# Patient Record
Sex: Female | Born: 1975 | Race: White | Hispanic: Yes | Marital: Married | Smoking: Former smoker
Health system: Southern US, Community
[De-identification: ages and names within clinical notes are randomized; demographics above are authoritative.]

## PROBLEM LIST (undated history)

## (undated) DIAGNOSIS — E119 Type 2 diabetes mellitus without complications: Secondary | ICD-10-CM

## (undated) DIAGNOSIS — F32A Depression, unspecified: Secondary | ICD-10-CM

## (undated) DIAGNOSIS — E669 Obesity, unspecified: Secondary | ICD-10-CM

## (undated) DIAGNOSIS — F329 Major depressive disorder, single episode, unspecified: Secondary | ICD-10-CM

## (undated) DIAGNOSIS — H11153 Pinguecula, bilateral: Secondary | ICD-10-CM

## (undated) DIAGNOSIS — E785 Hyperlipidemia, unspecified: Secondary | ICD-10-CM

## (undated) HISTORY — DX: Hyperlipidemia, unspecified: E78.5

## (undated) HISTORY — DX: Type 2 diabetes mellitus without complications: E11.9

## (undated) HISTORY — DX: Major depressive disorder, single episode, unspecified: F32.9

## (undated) HISTORY — DX: Pinguecula, bilateral: H11.153

## (undated) HISTORY — DX: Depression, unspecified: F32.A

## (undated) HISTORY — DX: Obesity, unspecified: E66.9

---

## 1999-11-17 ENCOUNTER — Emergency Department (HOSPITAL_COMMUNITY): Admission: EM | Admit: 1999-11-17 | Discharge: 1999-11-17 | Payer: Self-pay | Admitting: Emergency Medicine

## 1999-11-19 ENCOUNTER — Encounter: Payer: Self-pay | Admitting: Emergency Medicine

## 2000-02-06 ENCOUNTER — Ambulatory Visit (HOSPITAL_COMMUNITY): Admission: RE | Admit: 2000-02-06 | Discharge: 2000-02-06 | Payer: Self-pay | Admitting: *Deleted

## 2000-05-19 ENCOUNTER — Inpatient Hospital Stay (HOSPITAL_COMMUNITY): Admission: AD | Admit: 2000-05-19 | Discharge: 2000-05-19 | Payer: Self-pay | Admitting: Obstetrics

## 2000-07-02 ENCOUNTER — Inpatient Hospital Stay (HOSPITAL_COMMUNITY): Admission: AD | Admit: 2000-07-02 | Discharge: 2000-07-05 | Payer: Self-pay | Admitting: Obstetrics

## 2000-07-02 ENCOUNTER — Encounter (INDEPENDENT_AMBULATORY_CARE_PROVIDER_SITE_OTHER): Payer: Self-pay

## 2004-06-22 ENCOUNTER — Ambulatory Visit: Payer: Self-pay | Admitting: Internal Medicine

## 2004-08-30 ENCOUNTER — Ambulatory Visit: Payer: Self-pay | Admitting: Family Medicine

## 2004-09-28 ENCOUNTER — Ambulatory Visit: Payer: Self-pay | Admitting: Family Medicine

## 2004-10-27 ENCOUNTER — Ambulatory Visit: Payer: Self-pay | Admitting: Family Medicine

## 2004-10-27 ENCOUNTER — Ambulatory Visit: Payer: Self-pay | Admitting: *Deleted

## 2005-03-19 ENCOUNTER — Ambulatory Visit: Payer: Self-pay | Admitting: Family Medicine

## 2005-04-20 ENCOUNTER — Ambulatory Visit: Payer: Self-pay | Admitting: Family Medicine

## 2005-05-30 ENCOUNTER — Emergency Department (HOSPITAL_COMMUNITY): Admission: EM | Admit: 2005-05-30 | Discharge: 2005-05-30 | Payer: Self-pay | Admitting: Emergency Medicine

## 2006-05-06 ENCOUNTER — Ambulatory Visit: Payer: Self-pay | Admitting: Family Medicine

## 2006-06-13 ENCOUNTER — Ambulatory Visit (HOSPITAL_COMMUNITY): Admission: RE | Admit: 2006-06-13 | Discharge: 2006-06-13 | Payer: Self-pay | Admitting: Family Medicine

## 2006-06-13 ENCOUNTER — Ambulatory Visit: Payer: Self-pay | Admitting: Family Medicine

## 2007-01-08 ENCOUNTER — Encounter (INDEPENDENT_AMBULATORY_CARE_PROVIDER_SITE_OTHER): Payer: Self-pay | Admitting: *Deleted

## 2007-04-24 DIAGNOSIS — E782 Mixed hyperlipidemia: Secondary | ICD-10-CM | POA: Insufficient documentation

## 2007-04-24 DIAGNOSIS — E785 Hyperlipidemia, unspecified: Secondary | ICD-10-CM

## 2007-04-24 HISTORY — DX: Hyperlipidemia, unspecified: E78.5

## 2007-04-28 ENCOUNTER — Ambulatory Visit: Payer: Self-pay | Admitting: Internal Medicine

## 2007-07-11 ENCOUNTER — Ambulatory Visit: Payer: Self-pay | Admitting: Family Medicine

## 2007-07-11 ENCOUNTER — Encounter (INDEPENDENT_AMBULATORY_CARE_PROVIDER_SITE_OTHER): Payer: Self-pay | Admitting: Nurse Practitioner

## 2007-07-11 ENCOUNTER — Encounter (INDEPENDENT_AMBULATORY_CARE_PROVIDER_SITE_OTHER): Payer: Self-pay | Admitting: Family Medicine

## 2007-07-11 LAB — CONVERTED CEMR LAB
Chlamydia, DNA Probe: NEGATIVE
GC Probe Amp, Genital: NEGATIVE

## 2007-07-22 ENCOUNTER — Ambulatory Visit: Payer: Self-pay | Admitting: Internal Medicine

## 2007-07-24 ENCOUNTER — Ambulatory Visit (HOSPITAL_COMMUNITY): Admission: RE | Admit: 2007-07-24 | Discharge: 2007-07-24 | Payer: Self-pay | Admitting: Family Medicine

## 2007-08-13 ENCOUNTER — Ambulatory Visit: Payer: Self-pay | Admitting: Family Medicine

## 2007-08-13 LAB — CONVERTED CEMR LAB
ALT: 16 units/L (ref 0–35)
CO2: 23 meq/L (ref 19–32)
Calcium: 9.2 mg/dL (ref 8.4–10.5)
Chloride: 106 meq/L (ref 96–112)
Cholesterol: 228 mg/dL — ABNORMAL HIGH (ref 0–200)
Creatinine, Ser: 0.68 mg/dL (ref 0.40–1.20)
Eosinophils Relative: 6 % — ABNORMAL HIGH (ref 0–5)
Free T4: 1.16 ng/dL (ref 0.89–1.80)
Glucose, Bld: 90 mg/dL (ref 70–99)
HCT: 42 % (ref 36.0–46.0)
Hemoglobin: 13.4 g/dL (ref 12.0–15.0)
Lymphocytes Relative: 35 % (ref 12–46)
Lymphs Abs: 2.1 10*3/uL (ref 0.7–4.0)
Monocytes Absolute: 0.4 10*3/uL (ref 0.1–1.0)
Monocytes Relative: 8 % (ref 3–12)
Neutro Abs: 2.9 10*3/uL (ref 1.7–7.7)
RBC: 4.76 M/uL (ref 3.87–5.11)
Total Bilirubin: 0.5 mg/dL (ref 0.3–1.2)
Total CHOL/HDL Ratio: 5.2
Triglycerides: 166 mg/dL — ABNORMAL HIGH (ref <150)
VLDL: 33 mg/dL (ref 0–40)
WBC: 5.8 10*3/uL (ref 4.0–10.5)

## 2007-09-08 ENCOUNTER — Ambulatory Visit: Payer: Self-pay | Admitting: Family Medicine

## 2007-09-16 ENCOUNTER — Ambulatory Visit (HOSPITAL_COMMUNITY): Admission: RE | Admit: 2007-09-16 | Discharge: 2007-09-16 | Payer: Self-pay | Admitting: Family Medicine

## 2009-08-10 IMAGING — US US TRANSVAGINAL NON-OB
1 series · 14 of 25 positions shown · non-contrast
Comparison: None

CLINICAL DATA: Pelvic pain mainly on the right

TRANSVAGINAL ULTRASOUND OF PELVIS,ULTRASOUND PELVIS COMPLETE -
MODIFY
TECHNIQUE: Transvaginal ultrasound examination of the pelvis was
performed including evaluation of the uterus, ovaries, adnexal
regions, and pelvic cul-de-sac,

[Series 1: unknown · 0.28mm/px · 14 of 55 slices shown]
[im 1/55]
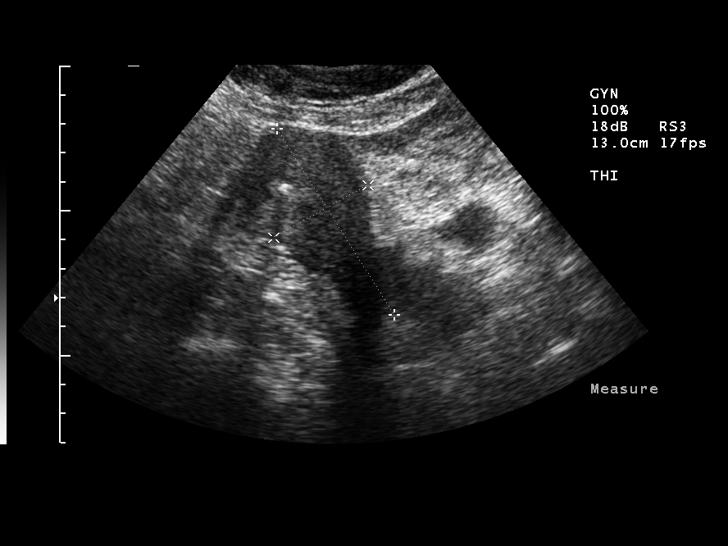
[im 5/55]
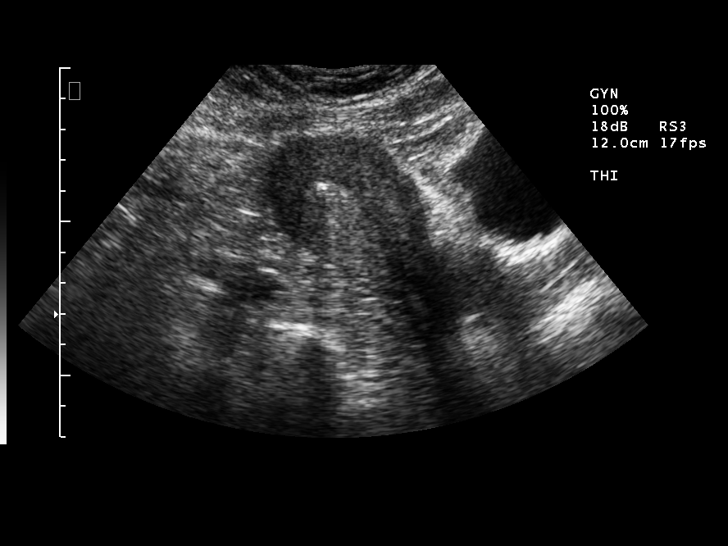
[im 10/55]
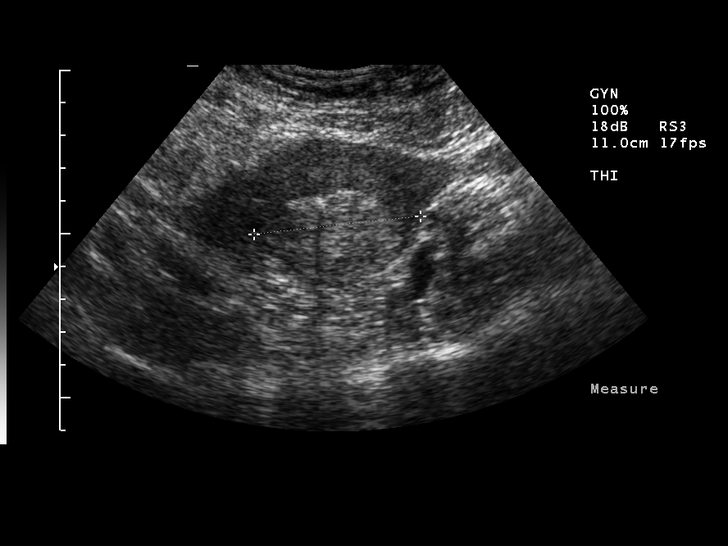
[im 14/55]
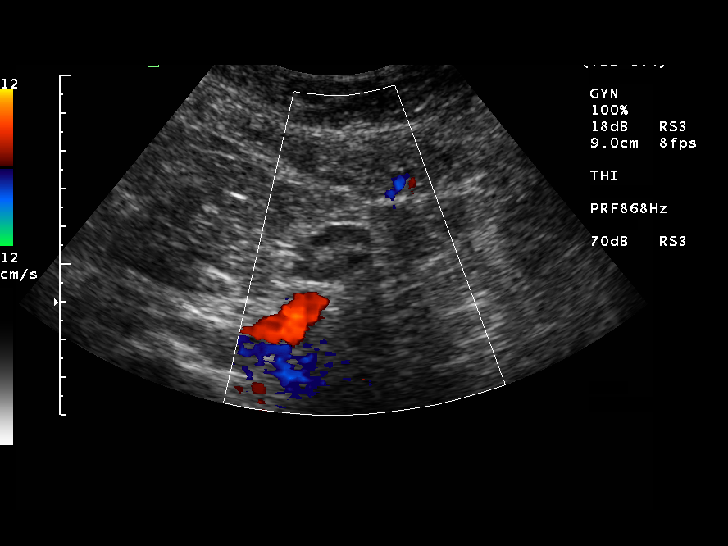
[im 19/55]
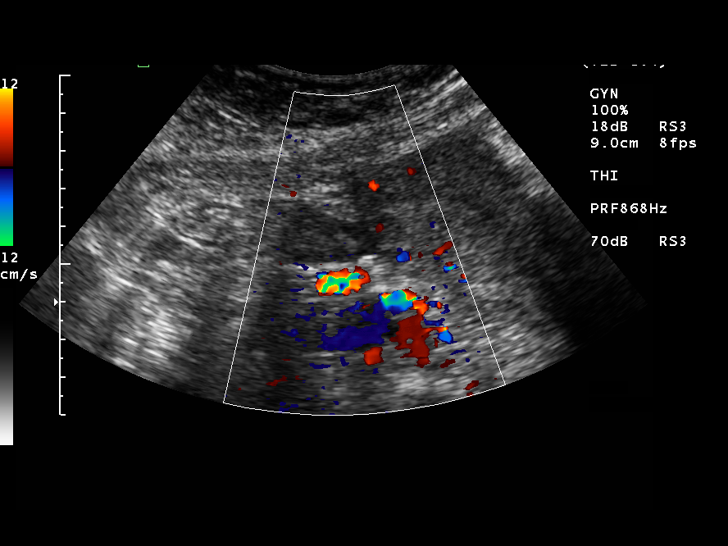
[im 21/55]
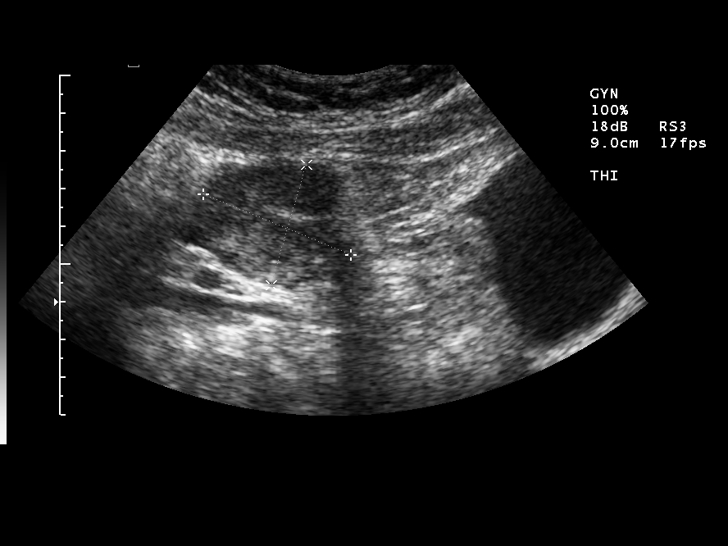
[im 25/55]
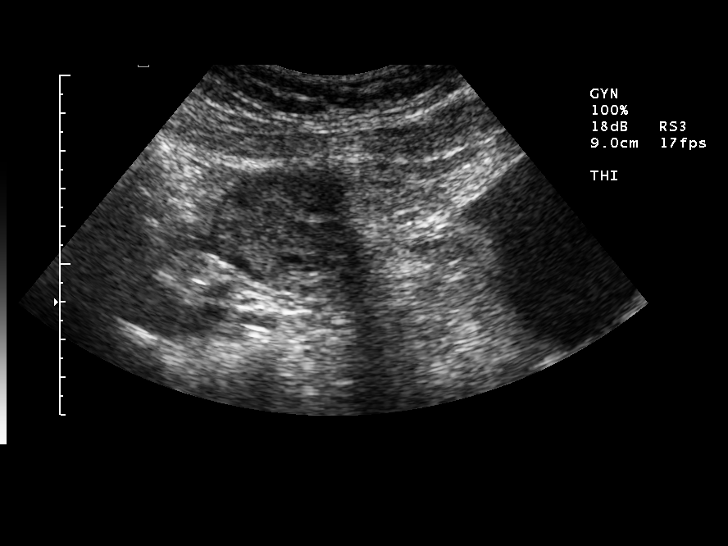
[im 30/55]
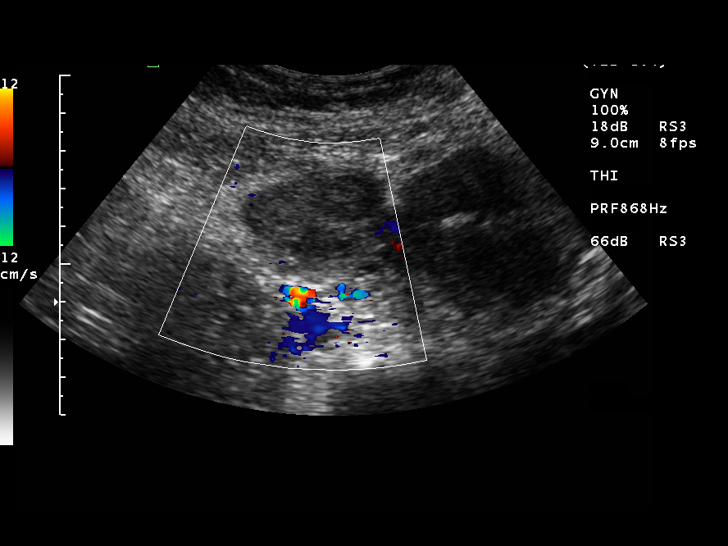
[im 34/55]
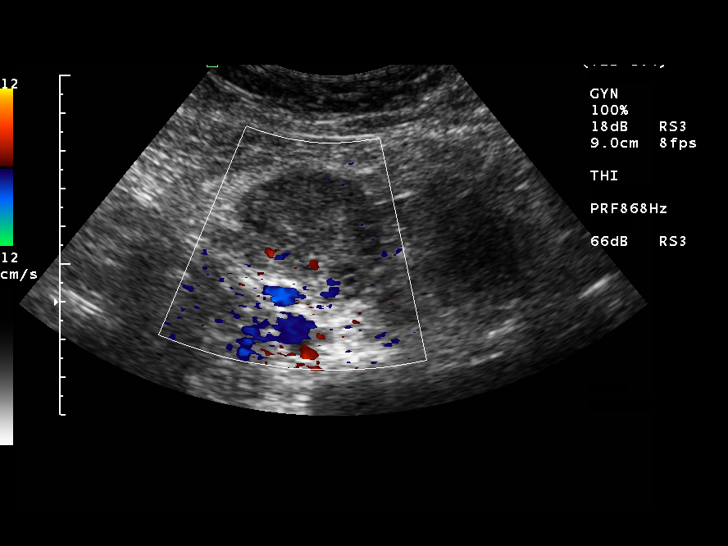
[im 37/55]
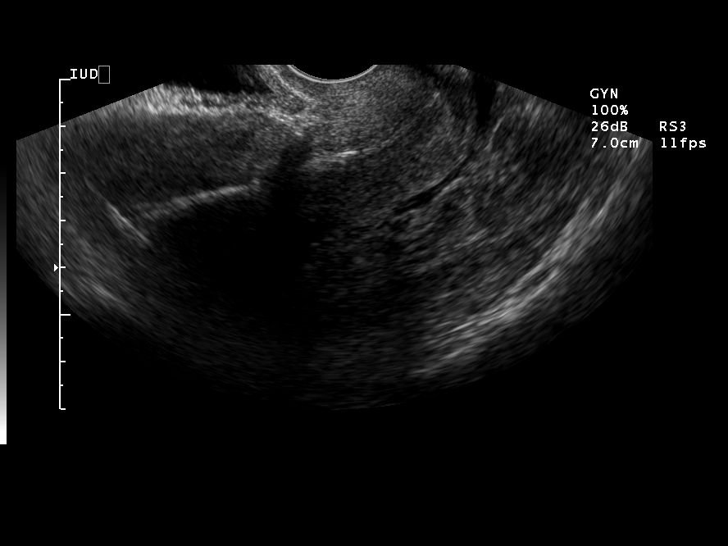
[im 41/55]
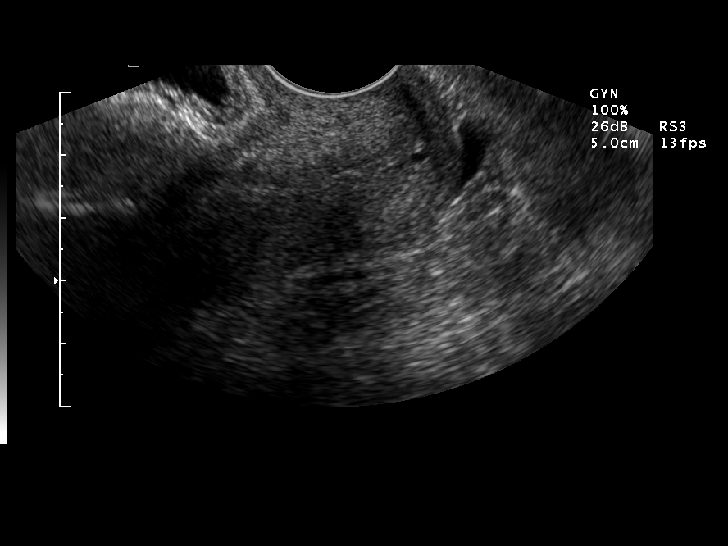
[im 46/55]
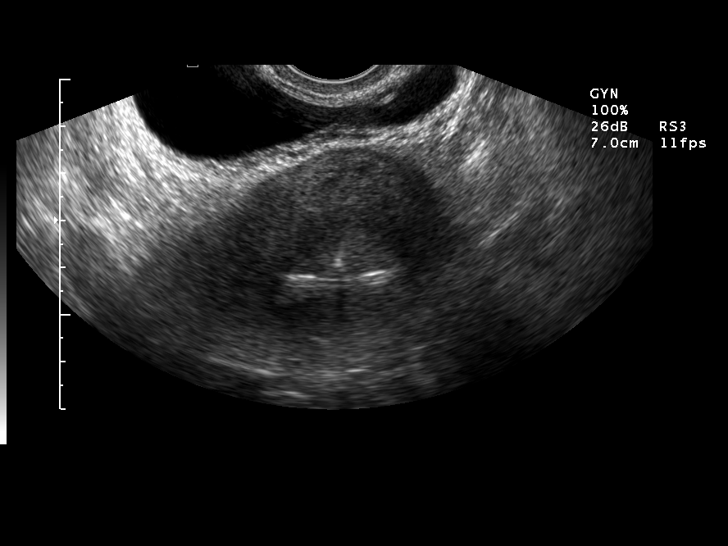
[im 50/55]
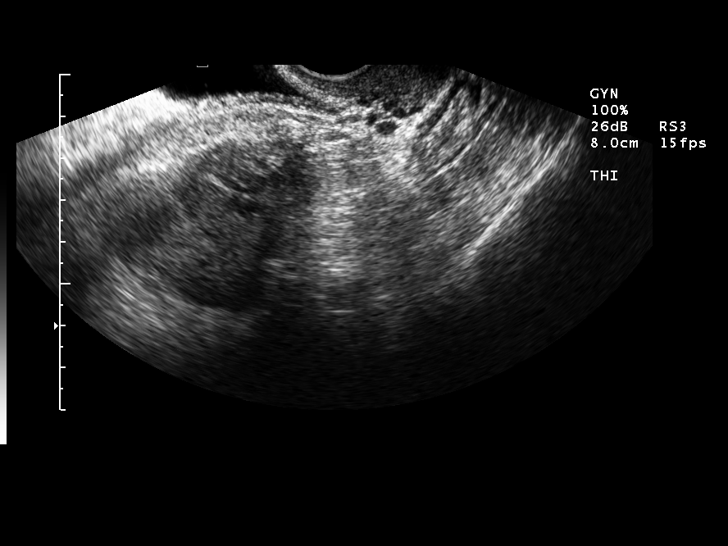
[im 55/55]
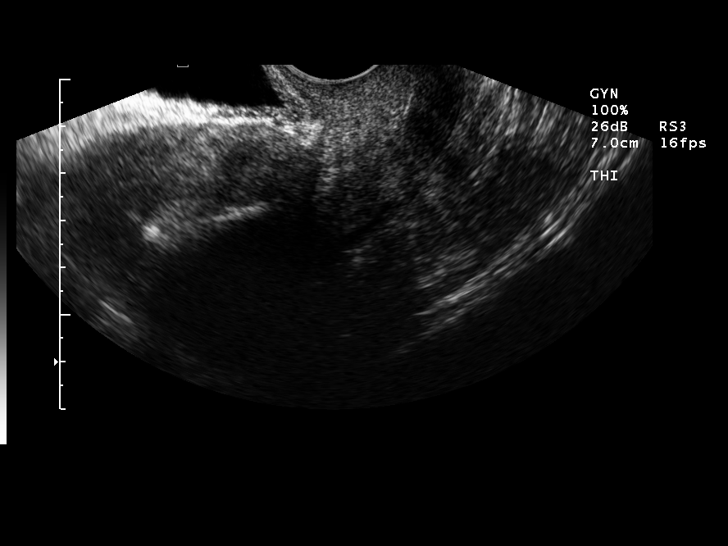

[14 of 25 positions shown; findings below may reference images not displayed]

FINDINGS: The uterus measures 8.0 cm in length and 3.2 cm x 5.1 cm
in  transverse dimensions.  Endometrial stripe complex measures 9mm
in thickness.  The right ovary measures 4.2 x 3.3 x 4.3 cm.  Left
ovary measures 2.5 x 1.3 x 2.2 cm.  The right ovary appears
somewhat prominent caliber and appears somewhat lobulated, however
no distinct mass is detected. It is possible that there is a
complex/hemorrhagic right ovarian cyst. There is color flow to the
right ovary.  Small amount of free pelvic fluid.
IMPRESSION: Prominent somewhat lobulated appearance to the right ovary without
distinct mass. Cannot exclude a complex hemorrhagic cyst of the
right ovary.  Recommend follow-up ultrasound in 4-6 weeks.  Small
amount of free pelvic fluid.

## 2010-04-23 HISTORY — PX: TUBAL LIGATION: SHX77

## 2010-09-08 NOTE — Op Note (Signed)
[facility]  Patient:    Kelsey Keith             MRN: [id number] Proc. Date: 07/02/00 Adm. Date:  [id number] Disc. Date: [id number] Attending:  Tammi Sou                           Operative Report  PREOPERATIVE DIAGNOSES:       1. Arrest of active phase of labor.                               2. Repetitive late fetal heart rate                                  decelerations.                               3. Deep variable fetal heart rate decelerations.                               4. Meconium-stained amniotic fluid.  POSTOPERATIVE DIAGNOSES:      1. Arrest of active phase of labor.                               2. Repetitive late fetal heart rate                                  decelerations.                               3. Deep variable fetal heart rate decelerations.                               4. Meconium-stained amniotic fluid.  OPERATION:                    Repeat low transverse cesarean delivery.  SURGEON:                      Conni Elliot, M.D.  ASSISTANT:                    Jamey Reas, M.D.  ANESTHESIA:                   Continuous lumbar epidural.  OPERATIVE FINDINGS:           Female infant with Apgars of 9 and 9. Cord pH was 7.35 Placenta was sent to pathology.  ESTIMATED BLOOD LOSS:         800 cc.  DESCRIPTION OF PROCEDURE:     The patient is supine in left lateral tilt position receiving oxygen. The abdomen was prepped and draped in sterile fashion. A low transverse Pfannenstiel incision was made and incision made in through the skin, subcutaneous fascia and the rectus muscles separated in the midline. The peritoneal cavity was entered and bladder flap created. A low transverse uterine incision was made. The baby was in a vertex  presentation. The baby was DeLee suctioned prior to delivery of the chest. The remainder of the body was delivered, the cord doubly clamped and cut, and the  baby handed to the neonatologist in attendance. The placenta delivered spontaneously. The uterus, bladder flap, anterior peritoneum, fascia, and skin closed in routine fashion. Estimated blood loss approximately 800 cc and needle, sponge, and instruments were correct. DD:  08/21/00 TD:  08/21/00 Job: [id number]

## 2010-09-08 NOTE — Discharge Summary (Signed)
[facility]  Patient:    Kelsey Keith             MRN: [id number] Adm. Date:  [id number] Disc. Date: 07/05/00 Attending:  Tammi Sou Dictator:   Ocie Doyne, M.D.                           Discharge Summary  DATE OF BIRTH:                Nov 06, 1975  DISCHARGE DIAGNOSES:          1. Status post low transverse cesarean section,                                  delivery of a viable female infant.                               2. Arrest of active phase of labor.  DISCHARGE MEDICATIONS:        1. Motrin 600 mg one p.o. q.6h. p.r.n. pain.                               2. Percocet 5/325 mg one p.o. q.6h. p.r.n. pain.                               3. Prenatal vitamin one p.o. q.d.  ADMISSION HISTORY:            This 35 year old, G2, P 1-0-0-1, at 64 and [redacted] weeks gestation by LMP and second trimester ultrasound presented with uterine contractions, spontaneous rupture of membranes, and meconium amniotic fluid. She had obtained prenatal care at [facility].  MEDICATIONS PRIOR TO ADMISSION:  Prenatal vitamins.  SOCIAL HISTORY:               She is a nonsmoker, nondrinker.  PHYSICAL EXAMINATION ON ADMISSION:  VITAL SIGNS:                  Her vitals were stable.  EXTREMITIES:                  There was no clonus, no edema.  PELVIC:                       Sterile vaginal exam revealed a cervix 2 cm dilated, 100% effaced at -1 station with vertex presentation, positive ferning, positive meconium.  Fetal heart rate was in the 130s with good long-term variability, no decelerations, and nonreactive.  Contractions were every three minutes lasting 60 seconds of moderate intensity.  HOSPITAL COURSE:              The patient was admitted to labor and delivery for expectant management.  An IUPC and fetal scalp electrode were placed and amnioinfusion was initiated.  At the patients request, she received epidural anesthesia for pain  management.  After several hours of adequate labor, fetal heart tones were nonreassuring with repetitive late decelerations.  They did improve with administration of oxygen to the mother and repositioning onto her side; however, the active phase of labor appeared to have stalled at 5-6 cm of cervical dilation, and she was taken to the OR for a  repeat low transverse cesarean section.  She delivered a viable female infant in cephalic presentation with one and five minute Apgars of 9 and 9 respectively.  Cord pH was 7.35.  There were no intraoperative complications.  Estimated blood loss was 800 cc.  The patients postpartum course was unremarkable.  At discharge she had minimal lochia, was complaining of some mild abdominal pain which was adequately controlled on p.o. Percocet, had had a bowel movement, and was ambulating and eating without difficulty.  Her incision was clean, dry, and intact with no erythema and no drainage.  She was breast feeding her infant without complications and stated a desire to have the Depo-Provera injection prior to discharge for contraception.  DISCHARGE FOLLOWUP:           She will follow up at the [facility] in six weeks for a routine postpartum check. DD:  07/05/00 TD:  07/05/00 Job: [id number]

## 2010-10-18 ENCOUNTER — Encounter (HOSPITAL_COMMUNITY)
Admission: RE | Admit: 2010-10-18 | Discharge: 2010-10-18 | Disposition: A | Discharge status: Home / Self Care | Payer: Self-pay | Source: Ambulatory Visit | Attending: Obstetrics | Admitting: Obstetrics

## 2010-10-18 DIAGNOSIS — Z01812 Encounter for preprocedural laboratory examination: Secondary | ICD-10-CM | POA: Insufficient documentation

## 2010-10-18 LAB — SURGICAL PCR SCREEN: Staphylococcus aureus: NEGATIVE

## 2010-10-18 LAB — CBC
HCT: 37 % (ref 36.0–46.0)
Hemoglobin: 12.4 g/dL (ref 12.0–15.0)
MCV: 81.1 fL (ref 78.0–100.0)
RDW: 15.2 % (ref 11.5–15.5)
WBC: 8.8 10*3/uL (ref 4.0–10.5)

## 2010-10-18 LAB — RPR: RPR Ser Ql: NONREACTIVE

## 2010-10-26 ENCOUNTER — Inpatient Hospital Stay (HOSPITAL_COMMUNITY): Admission: RE | Admit: 2010-10-26 | Payer: Self-pay | Source: Ambulatory Visit | Admitting: Obstetrics

## 2010-10-26 ENCOUNTER — Other Ambulatory Visit: Payer: Self-pay | Admitting: Obstetrics

## 2010-10-26 ENCOUNTER — Inpatient Hospital Stay (HOSPITAL_COMMUNITY)
Admission: RE | Admit: 2010-10-26 | Discharge: 2010-10-29 | DRG: 766 | Disposition: A | Discharge status: Home / Self Care | Payer: Medicaid Other | Source: Ambulatory Visit | Attending: Obstetrics | Admitting: Obstetrics

## 2010-10-26 DIAGNOSIS — Z302 Encounter for sterilization: Secondary | ICD-10-CM

## 2010-10-26 DIAGNOSIS — O34219 Maternal care for unspecified type scar from previous cesarean delivery: Principal | ICD-10-CM | POA: Diagnosis present

## 2010-10-26 DIAGNOSIS — Z01812 Encounter for preprocedural laboratory examination: Secondary | ICD-10-CM

## 2010-10-26 DIAGNOSIS — Z01818 Encounter for other preprocedural examination: Secondary | ICD-10-CM

## 2010-10-26 LAB — TYPE AND SCREEN: Antibody Screen: NEGATIVE

## 2010-10-26 LAB — ABO/RH: ABO/RH(D): O POS

## 2010-10-27 LAB — CBC
MCH: 26.6 pg (ref 26.0–34.0)
MCHC: 32.7 g/dL (ref 30.0–36.0)
Platelets: 207 10*3/uL (ref 150–400)
RDW: 15.8 % — ABNORMAL HIGH (ref 11.5–15.5)

## 2010-10-27 MED ORDER — DIPHENHYDRAMINE HCL 50 MG/ML IJ SOLN: 12.5000 mg | INTRAMUSCULAR | Status: DC | PRN

## 2010-10-27 MED ORDER — NALOXONE HCL 0.4 MG/ML IJ SOLN
1.0000 ug/kg/h | INTRAVENOUS | Status: DC | PRN
  Filled 2010-10-27: qty 2.5

## 2010-10-27 MED ORDER — LACTATED RINGERS IV SOLN: INTRAVENOUS | Status: DC

## 2010-10-27 MED ORDER — PSEUDOEPHEDRINE HCL 30 MG PO TABS: 60.0000 mg | ORAL_TABLET | ORAL | Status: DC | PRN

## 2010-10-27 MED ORDER — MEASLES, MUMPS & RUBELLA VAC ~~LOC~~ INJ
0.5000 mL | INJECTION | Freq: Once | SUBCUTANEOUS | Status: AC | PRN
  Filled 2010-10-27: qty 0.5

## 2010-10-27 MED ORDER — SENNOSIDES-DOCUSATE SODIUM 8.6-50 MG PO TABS
1.0000 | ORAL_TABLET | Freq: Every day | ORAL | Status: DC
  Administered 2010-10-29: 1 via ORAL
  Filled 2010-10-27: qty 2

## 2010-10-27 MED ORDER — NALBUPHINE HCL 10 MG/ML IJ SOLN
5.0000 mg | INTRAMUSCULAR | Status: DC | PRN
  Filled 2010-10-27: qty 1

## 2010-10-27 MED ORDER — SIMETHICONE 80 MG PO CHEW
80.0000 mg | CHEWABLE_TABLET | ORAL | Status: DC | PRN
  Filled 2010-10-27: qty 1

## 2010-10-27 MED ORDER — MEDROXYPROGESTERONE ACETATE 150 MG/ML IM SUSP: 150.0000 mg | Freq: Once | INTRAMUSCULAR | Status: AC | PRN

## 2010-10-27 MED ORDER — MENTHOL 3 MG MT LOZG: 1.0000 | LOZENGE | OROMUCOSAL | Status: DC | PRN

## 2010-10-27 MED ORDER — MAGNESIUM HYDROXIDE 400 MG/5ML PO SUSP: 30.0000 mL | ORAL | Status: DC | PRN

## 2010-10-27 MED ORDER — DIPHENHYDRAMINE HCL 50 MG/ML IJ SOLN: 25.0000 mg | INTRAMUSCULAR | Status: DC | PRN

## 2010-10-27 MED ORDER — ONDANSETRON HCL 4 MG/2ML IJ SOLN: 4.0000 mg | Freq: Three times a day (TID) | INTRAMUSCULAR | Status: DC | PRN

## 2010-10-27 MED ORDER — BISACODYL 10 MG RE SUPP: 10.0000 mg | Freq: Every day | RECTAL | Status: DC | PRN

## 2010-10-27 MED ORDER — ONDANSETRON HCL 4 MG/2ML IJ SOLN: 4.0000 mg | INTRAMUSCULAR | Status: DC | PRN

## 2010-10-27 MED ORDER — FERROUS SULFATE 325 (65 FE) MG PO TABS
325.0000 mg | ORAL_TABLET | Freq: Two times a day (BID) | ORAL | Status: DC
  Administered 2010-10-28 – 2010-10-29 (×3): 325 mg via ORAL
  Filled 2010-10-27 (×3): qty 1

## 2010-10-27 MED ORDER — CALCIUM CARBONATE ANTACID 500 MG PO CHEW: 1.0000 | CHEWABLE_TABLET | ORAL | Status: DC | PRN

## 2010-10-27 MED ORDER — NALOXONE HCL 0.4 MG/ML IJ SOLN: 0.4000 mg | INTRAMUSCULAR | Status: DC | PRN

## 2010-10-27 MED ORDER — HYDROCORTISONE 1 % EX OINT: TOPICAL_OINTMENT | Freq: Four times a day (QID) | CUTANEOUS | Status: DC | PRN

## 2010-10-27 MED ORDER — FLEET ENEMA 7-19 GM/118ML RE ENEM
1.0000 | ENEMA | RECTAL | Status: DC | PRN
  Filled 2010-10-27: qty 1

## 2010-10-27 MED ORDER — SODIUM CHLORIDE 0.9 % IJ SOLN
3.0000 mL | INTRAMUSCULAR | Status: DC | PRN
  Filled 2010-10-27: qty 3

## 2010-10-27 MED ORDER — WITCH HAZEL-GLYCERIN EX PADS: MEDICATED_PAD | CUTANEOUS | Status: DC | PRN

## 2010-10-27 MED ORDER — NALOXONE HCL 0.4 MG/ML IJ SOLN: 0.1000 mg | INTRAMUSCULAR | Status: DC | PRN

## 2010-10-27 MED ORDER — DIPHENHYDRAMINE HCL 25 MG PO CAPS: 25.0000 mg | ORAL_CAPSULE | ORAL | Status: DC | PRN

## 2010-10-27 MED ORDER — SIMETHICONE 80 MG PO CHEW
80.0000 mg | CHEWABLE_TABLET | Freq: Three times a day (TID) | ORAL | Status: DC
  Administered 2010-10-28 – 2010-10-29 (×5): 80 mg via ORAL
  Filled 2010-10-27 (×6): qty 1

## 2010-10-27 MED ORDER — IBUPROFEN 600 MG PO TABS: 600.0000 mg | ORAL_TABLET | Freq: Four times a day (QID) | ORAL | Status: DC | PRN

## 2010-10-27 MED ORDER — DIPHENHYDRAMINE HCL 25 MG PO CAPS: 25.0000 mg | ORAL_CAPSULE | Freq: Four times a day (QID) | ORAL | Status: DC | PRN

## 2010-10-27 MED ORDER — ZOLPIDEM TARTRATE 5 MG PO TABS: 5.0000 mg | ORAL_TABLET | Freq: Every evening | ORAL | Status: DC | PRN

## 2010-10-27 MED ORDER — METHYLERGONOVINE MALEATE 0.2 MG/ML IJ SOLN: 0.2000 mg | Freq: Four times a day (QID) | INTRAMUSCULAR | Status: DC | PRN

## 2010-10-27 MED ORDER — SODIUM CHLORIDE 0.9 % IJ SOLN
3.0000 mL | Freq: Two times a day (BID) | INTRAMUSCULAR | Status: DC
  Filled 2010-10-27 (×3): qty 3

## 2010-10-27 MED ORDER — GUAIFENESIN 100 MG/5ML PO SOLN: 15.0000 mL | ORAL | Status: DC | PRN

## 2010-10-27 MED ORDER — METOCLOPRAMIDE HCL 5 MG/ML IJ SOLN: 10.0000 mg | Freq: Three times a day (TID) | INTRAMUSCULAR | Status: DC | PRN

## 2010-10-27 MED ORDER — DIBUCAINE 1 % RE OINT: TOPICAL_OINTMENT | RECTAL | Status: DC | PRN

## 2010-10-27 MED ORDER — HYDROCORTISONE ACE-PRAMOXINE 1-1 % RE FOAM: 1.0000 | RECTAL | Status: DC | PRN

## 2010-10-27 MED ORDER — ONDANSETRON HCL 4 MG PO TABS: 4.0000 mg | ORAL_TABLET | ORAL | Status: DC | PRN

## 2010-10-27 MED ORDER — METHYLERGONOVINE MALEATE 0.2 MG PO TABS: 0.2000 mg | ORAL_TABLET | ORAL | Status: DC | PRN

## 2010-10-27 MED ORDER — OXYCODONE-ACETAMINOPHEN 5-325 MG PO TABS
1.0000 | ORAL_TABLET | ORAL | Status: DC | PRN
  Administered 2010-10-28 – 2010-10-29 (×3): 1 via ORAL
  Filled 2010-10-27 (×2): qty 1

## 2010-10-27 MED ORDER — HYDROCORTISONE 1 % EX CREA: TOPICAL_CREAM | Freq: Four times a day (QID) | CUTANEOUS | Status: DC | PRN

## 2010-10-27 MED ORDER — IBUPROFEN 600 MG PO TABS
600.0000 mg | ORAL_TABLET | Freq: Four times a day (QID) | ORAL | Status: DC
  Administered 2010-10-28 – 2010-10-29 (×5): 600 mg via ORAL
  Filled 2010-10-27 (×4): qty 1

## 2010-10-28 NOTE — Progress Notes (Signed)
Post Partum Day 2 Subjective: no complaints  Objective: Blood pressure 125/76, pulse 89, temperature 98 F (36.7 C), temperature source Oral, resp. rate 18, height 5' (1.524 m), weight 75.75 kg (167 lb).  Physical Exam:  General: alert Lochia: appropriate Uterine Fundus: firm Incision: healing well DVT Evaluation: No evidence of DVT seen on physical exam.   Basename 10/27/10 0535  HGB 10.9*  HCT 33.3*    Assessment/Plan: Plan for discharge tomorrow   LOS: 2 days   Taimane Stimmel A 10/28/2010, 7:54 AM

## 2010-10-29 MED ORDER — ACETAMINOPHEN-CODEINE 300-30 MG PO TABS
1.0000 | ORAL_TABLET | ORAL | Status: AC | PRN
Start: 2010-10-29 — End: 2010-11-12

## 2010-10-29 NOTE — Plan of Care (Signed)
Problem: Discharge Progression Outcomes Goal: MMR given as ordered Outcome: Completed/Met Date Met:  10/29/10 immune

## 2010-10-29 NOTE — Progress Notes (Signed)
  Postop day postop day 3 vital signs normal Fundus firm Incision clean and dry Legs negative

## 2010-10-29 NOTE — Discharge Summary (Signed)
Obstetric Discharge Summary Reason for Admission: cesarean section Prenatal Procedures: none Intrapartum Procedures: cesarean: low cervical, transverse Postpartum Procedures: none Complications-Operative and Postpartum: none  Hemoglobin  Date Value Range Status  10/27/2010 10.9* 12.0-15.0 (g/dL) Final     HCT  Date Value Range Status  10/27/2010 33.3* 36.0-46.0 (%) Final    Discharge Diagnoses: Term Pregnancy-delivered  Discharge Information: Date: 10/29/2010 Activity: unrestricted and pelvic rest Diet: routine Medications: Tylenol #3 Condition: stable Instructions: refer to practice specific booklet Discharge to: home Follow-up Information    Follow up with MARSHALL,BERNARD A. Make an appointment in 6 weeks.   Contact information:   [address] [place] [address] [phone]          Newborn Data: Live born  Information for the patient's newborn:  Rashiya, [place] [[id number]]  female ; APGAR , ; weight ;  Home with mother.  MARSHALL,BERNARD A 10/29/2010, 6:50 AM

## 2010-11-02 ENCOUNTER — Ambulatory Visit (HOSPITAL_COMMUNITY)
Admit: 2010-11-02 | Discharge: 2010-11-02 | Disposition: A | Discharge status: Home / Self Care | Payer: Medicaid Other | Attending: Obstetrics | Admitting: Obstetrics

## 2010-11-02 NOTE — Progress Notes (Signed)
MOM AND BABY WERE SCHEDULED FOR FEEDING ASSESSMENT PRIOR TO DISCHARGE BECAUSE OF >7% WEIGHT LOSS IN HOSPITAL AND USING SNS.  WEIGHT TODAY 8-3.7  BABY HAS BEEN FEEDING EVERY 1-3 HOURS FOR 15-30 MIN. AND NO SUPPLEMENT BEING GIVEN.  BABY LATCHED EASILY IN OFFICE TODAY WITH MINIMAL ASSIST IN OBTAINING WIDER LATCH.  BABY NURSED WELL ON BOTH BREASTS WITH PC WEIGHT GAIN OF 60 CC'S.  5-6 VOIDS/24 HRS.  2-3 STOOLS/24 HRS.  BASIC BF TEACHING REVIEWED.    ENCOURAGED TO CALL WITH QUESTIONS OR CONERNS.

## 2010-11-05 ENCOUNTER — Inpatient Hospital Stay (HOSPITAL_COMMUNITY): Admission: AD | Admit: 2010-11-05 | Payer: Self-pay | Source: Home / Self Care | Admitting: Obstetrics

## 2014-06-28 ENCOUNTER — Telehealth: Payer: Self-pay | Admitting: *Deleted

## 2014-06-28 NOTE — Telephone Encounter (Signed)
error 

## 2016-06-13 ENCOUNTER — Ambulatory Visit: Payer: Self-pay | Admitting: Internal Medicine

## 2016-07-09 ENCOUNTER — Encounter: Payer: Self-pay | Admitting: Internal Medicine

## 2016-07-09 ENCOUNTER — Ambulatory Visit (INDEPENDENT_AMBULATORY_CARE_PROVIDER_SITE_OTHER): Payer: Self-pay | Admitting: Internal Medicine

## 2016-07-09 VITALS — BP 124/72 | HR 86 | Resp 12 | Ht 61.0 in | Wt 191.0 lb

## 2016-07-09 DIAGNOSIS — F32A Depression, unspecified: Secondary | ICD-10-CM | POA: Insufficient documentation

## 2016-07-09 DIAGNOSIS — E782 Mixed hyperlipidemia: Secondary | ICD-10-CM

## 2016-07-09 DIAGNOSIS — R079 Chest pain, unspecified: Secondary | ICD-10-CM

## 2016-07-09 DIAGNOSIS — Z6836 Body mass index (BMI) 36.0-36.9, adult: Secondary | ICD-10-CM

## 2016-07-09 DIAGNOSIS — F329 Major depressive disorder, single episode, unspecified: Secondary | ICD-10-CM | POA: Insufficient documentation

## 2016-07-09 DIAGNOSIS — E669 Obesity, unspecified: Secondary | ICD-10-CM

## 2016-07-09 DIAGNOSIS — J351 Hypertrophy of tonsils: Secondary | ICD-10-CM

## 2016-07-09 DIAGNOSIS — E6609 Other obesity due to excess calories: Secondary | ICD-10-CM

## 2016-07-09 DIAGNOSIS — R0683 Snoring: Secondary | ICD-10-CM

## 2016-07-09 DIAGNOSIS — F339 Major depressive disorder, recurrent, unspecified: Secondary | ICD-10-CM | POA: Insufficient documentation

## 2016-07-09 HISTORY — DX: Obesity, unspecified: E66.9

## 2016-07-09 MED ORDER — NITROGLYCERIN 0.4 MG SL SUBL: 0.4000 mg | SUBLINGUAL_TABLET | SUBLINGUAL | 0 refills | Status: DC | PRN

## 2016-07-09 NOTE — Patient Instructions (Signed)
Tome aspirin 81 mg todos los dias con desayuno.

## 2016-07-09 NOTE — Progress Notes (Signed)
Subjective:    Patient ID: Kelsey Keith, female    DOB: July 15, 1975, 41 y.o.   MRN: [id number]  HPI   Here to establish  1.  Chest pain:  Pinching in chest  in very limited area just lateral to left sternum for 2-3 months--felt like a real thin needle.  Could happen at any time--she really did not really pay attention.  Later, perhaps more likely if walking.  Would last for 20 seconds.  She would stop whatever she was doing and put pressure against her chest for 2-3 minutes.  She would return to what she was doing, but more slowly and generally her symptoms would not recur.   Feels this would occur once or twice weekly. Two weeks ago, started having chest pain that is more like someone is standing on chest.  This would occur only if walking or exerting herself.   Last episode 1 week ago. No radiation of discomfort to shoulder, jaw, arm, or back. No associated dyspnea, diaphoresis, or nausea. Parents are both dead.  Mother died from an MI as a complication of some sort of abdominal problem in her 23s.  She also suffered a stroke prior to that.  Father suffered a debilitating stroke in his 11s as well, leaving him bed bound, but died from an MI.   Maternal uncles with history of MI, but all in their 45s or older (4)  Has never been told she has diabetes or her sugar is high--90 in 2009 fasting..   Her cholesterol in 2009 was 228 with HDL at 44, LDL 151 and Trigs of 166  History of smoking for 20 years. 1 ppd.   Has not smoked for 5 years.    Does have obesity.  She does snore when she sleeps and can have apneic episodes up to about 20 seconds.  Husband has noted 3 episodes in the past year.    No outpatient prescriptions have been marked as taking for the 07/09/16 encounter (Office Visit) with [name], MD.    No Known Allergies   Past Medical History:  Diagnosis Date  . Depression age 42 yo and 2002   First episode age 4 yo, then another after child born 2002.   Was treated for 5 years then with Zoloft.  Has not needed treatment since 2007.  Marland Kitchen Hyperlipidemia 2009  . Obesity 07/09/2016    Past Surgical History:  Procedure Laterality Date  . CESAREAN SECTION  1999, 2002,2012  . TUBAL LIGATION  2012    Family History  Problem Relation Age of Onset  . Heart disease Mother   . Stroke Mother   . Hypertension Mother   . Heart disease Father   . Stroke Father   . Diabetes Brother     Social History   Social History  . Marital status: Married    Spouse name: Alayn  . Number of children: 3  . 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 housecleaning    Social History Main Topics  . Smoking status: Former Smoker    Packs/day: 1.00    Years: 20.00    Types: Cigarettes  . Smokeless tobacco: Never Used  . Alcohol use No  . Drug use: No  . Sexual activity: Not on file   Other Topics Concern  . Not on file   Social History Narrative   Originally from [place]   Came to [employer] [place] [employer]. In 2000   Lives at home husband and  3 children.     Review of Systems     Objective:   Physical Exam  Obese, NAD HEENT:  PERRL, EOMI, discs sharp, TMs pearly gray, throat without injection, but tonsils quite large, though not displacing uvula Neck:  Supple, No adenopathy, no thyromegaly, acanthosis nigricans of posterior neck Chest:  CTA.  Tender along costochondral junction bilaterally with sternum. States tenderness does not reproduce the same pain--states the pain when she has it feels deeper.  CV:  RRR with normal S1 and S2, No S3, S4 or murmur.  No carotid bruits.  Carotid, radial, femoral, DP and PT pulses normal and equal Abd:  S, NT, No HSM or mass, + BS LE:  No edema.  ECG:  NSR, normal      Assessment & Plan:  1.  Atypical chest pain:  ECG without signs of injury.  Certainly with risk factors for CAD with obesity and family history.   CXR and apply for [facility] financial assistance.  Paperwork given. Check CBC,  CMP, lipids, A1C Encouraged healthier diet. ASA 81 mg daily NTG 0.4 mg SL every 5 minutes  x3 if chest pain.  To lie down after using first dose.  Call EMS if no improvement after 3rd dosing. Cardiology referral for further evaluation.  2.  Possible sleep apnea:  Will address possibly with ENT due to size of tonsils and consider where might be able to send for sleep study to evaluate when follows up

## 2016-07-10 ENCOUNTER — Telehealth: Payer: Self-pay | Admitting: Licensed Clinical Social Worker

## 2016-07-10 LAB — CBC WITH DIFFERENTIAL/PLATELET
Basophils Absolute: 0 x10E3/uL (ref 0.0–0.2)
Basos: 0 %
EOS (ABSOLUTE): 0.1 x10E3/uL (ref 0.0–0.4)
Eos: 2 %
Hematocrit: 36.7 % (ref 34.0–46.6)
Hemoglobin: 11.9 g/dL (ref 11.1–15.9)
Immature Grans (Abs): 0 x10E3/uL (ref 0.0–0.1)
Immature Granulocytes: 0 %
Lymphocytes Absolute: 3.2 x10E3/uL — ABNORMAL HIGH (ref 0.7–3.1)
Lymphs: 35 %
MCH: 25.9 pg — ABNORMAL LOW (ref 26.6–33.0)
MCHC: 32.4 g/dL (ref 31.5–35.7)
MCV: 80 fL (ref 79–97)
Monocytes Absolute: 0.6 x10E3/uL (ref 0.1–0.9)
Monocytes: 7 %
Neutrophils Absolute: 5.2 x10E3/uL (ref 1.4–7.0)
Neutrophils: 56 %
Platelets: 262 x10E3/uL (ref 150–379)
RBC: 4.6 x10E6/uL (ref 3.77–5.28)
RDW: 15 % (ref 12.3–15.4)
WBC: 9.2 x10E3/uL (ref 3.4–10.8)

## 2016-07-10 LAB — COMPREHENSIVE METABOLIC PANEL WITH GFR
ALT: 20 IU/L (ref 0–32)
AST: 12 IU/L (ref 0–40)
Albumin/Globulin Ratio: 1.2 (ref 1.2–2.2)
Albumin: 3.8 g/dL (ref 3.5–5.5)
Alkaline Phosphatase: 85 IU/L (ref 39–117)
BUN/Creatinine Ratio: 24 — ABNORMAL HIGH (ref 9–23)
BUN: 12 mg/dL (ref 6–24)
Bilirubin Total: 0.2 mg/dL (ref 0.0–1.2)
CO2: 21 mmol/L (ref 18–29)
Calcium: 8.7 mg/dL (ref 8.7–10.2)
Chloride: 99 mmol/L (ref 96–106)
Creatinine, Ser: 0.49 mg/dL — ABNORMAL LOW (ref 0.57–1.00)
GFR calc Af Amer: 138 mL/min/1.73
GFR calc non Af Amer: 120 mL/min/1.73
Globulin, Total: 3.2 g/dL (ref 1.5–4.5)
Glucose: 229 mg/dL — ABNORMAL HIGH (ref 65–99)
Potassium: 4.3 mmol/L (ref 3.5–5.2)
Sodium: 138 mmol/L (ref 134–144)
Total Protein: 7 g/dL (ref 6.0–8.5)

## 2016-07-10 LAB — LIPID PANEL W/O CHOL/HDL RATIO
Cholesterol, Total: 233 mg/dL — ABNORMAL HIGH (ref 100–199)
HDL: 48 mg/dL
LDL Calculated: 145 mg/dL — ABNORMAL HIGH (ref 0–99)
Triglycerides: 200 mg/dL — ABNORMAL HIGH (ref 0–149)
VLDL Cholesterol Cal: 40 mg/dL (ref 5–40)

## 2016-07-10 LAB — HGB A1C W/O EAG: Hgb A1c MFr Bld: 10.5 % — ABNORMAL HIGH (ref 4.8–5.6)

## 2016-07-10 NOTE — Telephone Encounter (Signed)
LCSW called pt to introduce social work services at the clinic. Pt reported no needs at this time.

## 2016-07-13 ENCOUNTER — Ambulatory Visit (HOSPITAL_COMMUNITY)
Admission: RE | Admit: 2016-07-13 | Discharge: 2016-07-13 | Disposition: A | Discharge status: Home / Self Care | Payer: Self-pay | Source: Ambulatory Visit | Attending: Internal Medicine | Admitting: Internal Medicine

## 2016-07-13 DIAGNOSIS — R079 Chest pain, unspecified: Secondary | ICD-10-CM | POA: Insufficient documentation

## 2016-07-13 NOTE — Addendum Note (Signed)
Addended by: Marcene DuosMULBERRY, Kailey Esquilin M on: 07/13/2016 09:58 AM   Modules accepted: Orders

## 2016-07-14 MED ORDER — METFORMIN HCL ER 500 MG PO TB24: 500.0000 mg | ORAL_TABLET | Freq: Every day | ORAL | 11 refills | Status: DC

## 2016-07-14 NOTE — Addendum Note (Signed)
Addended by: Marcene DuosMULBERRY, Rhiannan Kievit M on: 07/14/2016 11:47 AM   Modules accepted: Orders

## 2016-07-16 ENCOUNTER — Other Ambulatory Visit: Payer: Self-pay | Admitting: Obstetrics and Gynecology

## 2016-07-16 DIAGNOSIS — Z1231 Encounter for screening mammogram for malignant neoplasm of breast: Secondary | ICD-10-CM

## 2016-07-24 ENCOUNTER — Ambulatory Visit (HOSPITAL_COMMUNITY)
Admission: RE | Admit: 2016-07-24 | Discharge: 2016-07-24 | Disposition: A | Discharge status: Home / Self Care | Payer: Self-pay | Source: Ambulatory Visit | Attending: Obstetrics and Gynecology | Admitting: Obstetrics and Gynecology

## 2016-07-24 ENCOUNTER — Ambulatory Visit
Admission: RE | Admit: 2016-07-24 | Discharge: 2016-07-24 | Disposition: A | Discharge status: Home / Self Care | Payer: No Typology Code available for payment source | Source: Ambulatory Visit | Attending: Obstetrics and Gynecology | Admitting: Obstetrics and Gynecology

## 2016-07-24 ENCOUNTER — Encounter (HOSPITAL_COMMUNITY): Payer: Self-pay

## 2016-07-24 VITALS — BP 128/70 | Temp 97.5°F | Ht 61.0 in | Wt 191.4 lb

## 2016-07-24 DIAGNOSIS — Z1231 Encounter for screening mammogram for malignant neoplasm of breast: Secondary | ICD-10-CM

## 2016-07-24 DIAGNOSIS — Z1239 Encounter for other screening for malignant neoplasm of breast: Secondary | ICD-10-CM

## 2016-07-24 NOTE — Patient Instructions (Signed)
Explained breast self awareness with Reuel Boom. Patient did not need a Pap smear today due to last Pap smear was 06/18/2016. Let her know [facility] will cover Pap smears every 3 years unless has a history of abnormal Pap smears. Referred patient to the [facility] for a screening mammogram. Appointment scheduled for Tuesday, July 24, 2016 at 1110. Let patient know the Breast Center will follow up with her within the next couple weeks with results of mammogram by letter or phone. Byrd Hesselbach Resendiz-Rodriquez verbalized understanding.  Brannock, Kathaleen Maser, RN 12:55 PM

## 2016-07-24 NOTE — Progress Notes (Signed)
No complaints today.   Pap Smear: Pap smear not completed today. Last Pap smear was 06/18/2016 at the free cervical cancer screening at [facility] and normal. Per patient has no history of an abnormal Pap smear. Last Pap smear result is in EPIC.  Physical exam: Breasts Breasts symmetrical. No skin abnormalities bilateral breasts. No nipple retraction bilateral breasts. No nipple discharge bilateral breasts. No lymphadenopathy. No lumps palpated bilateral breasts. No complaints of pain or tenderness on exam. Referred patient to the [facility] for a screening mammogram. Appointment scheduled for Tuesday, July 24, 2016 at 1110.        Pelvic/Bimanual No Pap smear completed today since last Pap smear was 06/18/2016. Pap smear not indicated per BCCCP guidelines.   Smoking History: Patient has never smoked.  Patient Navigation: Patient education provided. Access to services provided for patient through [facility] program. Spanish interpreter provided.  Used Spanish interpreter Viviana Simpler from [facility].

## 2016-07-25 ENCOUNTER — Telehealth (HOSPITAL_COMMUNITY): Payer: Self-pay | Admitting: *Deleted

## 2016-07-25 ENCOUNTER — Encounter (HOSPITAL_COMMUNITY): Payer: Self-pay | Admitting: *Deleted

## 2016-07-25 ENCOUNTER — Other Ambulatory Visit (HOSPITAL_COMMUNITY): Payer: Self-pay | Admitting: *Deleted

## 2016-07-25 ENCOUNTER — Other Ambulatory Visit (HOSPITAL_COMMUNITY): Payer: Self-pay

## 2016-07-25 DIAGNOSIS — Z0000 Encounter for general adult medical examination without abnormal findings: Secondary | ICD-10-CM

## 2016-07-25 NOTE — Telephone Encounter (Signed)
Telephoned patient at home number and advised patient of negative pap smear results. Pap did show yeast and could try OTC medicine. Next pap smear due in three years.

## 2016-07-26 ENCOUNTER — Ambulatory Visit (INDEPENDENT_AMBULATORY_CARE_PROVIDER_SITE_OTHER): Payer: Self-pay | Admitting: Internal Medicine

## 2016-07-26 ENCOUNTER — Encounter: Payer: Self-pay | Admitting: Internal Medicine

## 2016-07-26 VITALS — BP 128/82 | HR 60 | Resp 12 | Ht 60.5 in | Wt 192.0 lb

## 2016-07-26 DIAGNOSIS — E119 Type 2 diabetes mellitus without complications: Secondary | ICD-10-CM | POA: Insufficient documentation

## 2016-07-26 HISTORY — DX: Type 2 diabetes mellitus without complications: E11.9

## 2016-07-26 LAB — GLUCOSE, POCT (MANUAL RESULT ENTRY): POC GLUCOSE: 221 mg/dL — AB (ref 70–99)

## 2016-07-26 MED ORDER — AGAMATRIX PRESTO W/DEVICE KIT: PACK | 0 refills | Status: DC

## 2016-07-26 MED ORDER — GLUCOSE BLOOD VI STRP: ORAL_STRIP | 12 refills | Status: DC

## 2016-07-26 NOTE — Patient Instructions (Signed)
Tome un vaso de agua antes de cada comida Tome un minimo de 6 a 8 vasos de agua diarios Coma tres veces al dia Coma una proteina y Neomia Dear grasa saludable con comida.  (huevos, pescado, pollo, pavo, y limite carnes rojas Coma 5 porciones diarias de legumbres.  Mezcle los colores Coma 2 porciones diarias de frutas con cascara cuando sea comestible Use platos pequeos Suelte su tenedor o cuchara despues de cada mordida hata que se mastique y se trague Come en la mesa con amigos o familiares por lo menos una vez al dia Apague la televisin y aparatos electrnicos durante la comida  Su objetivo debe ser perder Neomia Dear libra por semanaKeeping track of sugars:  In a spiral bound notebook      Month Name (January)  Day of month  B  L  D  BT            1  Time /sugar level  7:45 pm  150   2 3 4El Mez  Dia de Mez D  A  C  AC 1  08:00 159   5:00  250   2 3 4

## 2016-07-26 NOTE — Progress Notes (Deleted)
   Subjective:    Patient ID: Kelsey Keith, female    DOB: 06/17/1975, 41 y.o.   MRN: [id number]  HPI   1.  New Diagnosis of DM type 2:  Elevated blood glucose with labs drawn 3/19 with subsequent A1C of 10.5%.   Diet is fair.  No outpatient prescriptions have been marked as taking for the 07/26/16 encounter (Office Visit) with [name], MD.   No Known Allergies    Review of Systems     Objective:   Physical Exam  NAD Lungs:  CTA CV:  RRR without murmur or rub, radial and DPpulses normal and equal LE:  No edema        Assessment & Plan:  1.  DM:  Long discussion regarding lifestyle changes with diet and physical activity.   Start Metformin ER 500 mg daily with meal. Glucose journal. Monitor and strips to [facility] pharmacy.   Follow up in 4 weeks to discuss complications of diabetes better.

## 2016-07-27 ENCOUNTER — Encounter (HOSPITAL_COMMUNITY): Payer: Self-pay | Admitting: *Deleted

## 2016-07-27 ENCOUNTER — Other Ambulatory Visit (HOSPITAL_BASED_OUTPATIENT_CLINIC_OR_DEPARTMENT_OTHER): Payer: Self-pay

## 2016-07-27 ENCOUNTER — Ambulatory Visit: Payer: No Typology Code available for payment source | Admitting: *Deleted

## 2016-07-27 ENCOUNTER — Other Ambulatory Visit: Payer: Self-pay

## 2016-07-27 ENCOUNTER — Ambulatory Visit: Payer: No Typology Code available for payment source

## 2016-07-27 VITALS — BP 127/68 | Ht 60.0 in | Wt 192.0 lb

## 2016-07-27 DIAGNOSIS — Z0000 Encounter for general adult medical examination without abnormal findings: Secondary | ICD-10-CM

## 2016-07-27 LAB — HEMOGLOBIN A1C
Est. average glucose Bld gHb Est-mCnc: 263 mg/dL
HEMOGLOBIN A1C: 10.8 % — AB (ref 4.8–5.6)

## 2016-07-27 LAB — LIPID PANEL
CHOL/HDL RATIO: 5 ratio — AB (ref 0.0–4.4)
CHOLESTEROL TOTAL: 241 mg/dL — AB (ref 100–199)
HDL: 48 mg/dL (ref >39)
LDL Calculated: 166 mg/dL — ABNORMAL HIGH (ref 0–99)
TRIGLYCERIDES: 135 mg/dL (ref 0–149)
VLDL Cholesterol Cal: 27 mg/dL (ref 5–40)

## 2016-07-27 LAB — GLUCOSE (CC13): Glucose: 266 mg/dl — ABNORMAL HIGH (ref 70–140)

## 2016-07-27 NOTE — Progress Notes (Signed)
[facility] Initial Screening  Referred from [employer] for [employer] program   Interpretor: Mariel Galllego  Clinical Measurement: HT: 5'0  WT: 192lb BP: 117/72 BPx2:127/68 fasting labs drawn today, will review with patient when they result.   Medical History: patient does not have HTN or high cholesterol. She was diagnosed with diabetes yesterday.  Medication: Patient does not take medication for HTN or high cholesterol, she was started on metformin for high blood sugar yesterday by PCP but has not picked it up from the pharmacy yet.   Blood Pressure, Self Measurement: Not applicable   Nutrition: Patient eats 2 cups of fruit on an average day, she eats 1 cup of vegetables daily. She does not eat fish often. She does not eat more than 3 ounces of whole grains daily. She drinks 4-5 sodas a day. She watches her sodium intake.   Physical Activity: patient gets 30 minutes a day of physical activity in moderately. She does not do any vigorous physical activity/   Smoking Status: Patient quit smoking in 2014.   Quality of Life: Patient denies any bad mental health or physical health days in the last 30 days.   Risk Reduction and Counseling: Patients goal is to decrease her sugar intake since she was diagnosed with diabetes yesterday. She will work on cutting back her sugary drinks. She will start with drinking 1 soda a day instead of 4-5. Provided health coaching session one today, and pamphlets.   Navigation: Patient understands there will be 2 more health coaching sessions via phone call, will call her next week with second health coaching session and lab results.

## 2016-08-13 ENCOUNTER — Telehealth: Payer: Self-pay

## 2016-08-13 NOTE — Telephone Encounter (Signed)
Health Coaching #3  Initial Screening:07/27/2016  Interpretor:[name]  Goal: patients goal was to cut back on sugary beverages. She has been doing this and has lost 5 pounds. Encouraged patient to continue.   Navigation: Time spent with patient via phone: 10 Minutes, patient understands there will be a final follow up call in 4- 6 weeks.

## 2016-08-13 NOTE — Telephone Encounter (Signed)
Health Coaching #2  Initial Screening:07/27/2016  Interpretor:[name]   Labs: Fasting- Choelsterol:241 Triglycerides:135 LDL:166 HDL:48 Glucose:266 A1C:10.8  Reviewed labs with patient, routed lab results to her physican Dr.Mulberry.   Goal: Patients goal was to cut back on sugary drinks, she understands this is very important since she is newly diagnosed with diabetes.    Navigation: Time spent with patient via phone: 10 Minutes,  there will be a third health coaching session within 1 week.

## 2016-08-21 ENCOUNTER — Telehealth: Payer: Self-pay | Admitting: Internal Medicine

## 2016-08-23 ENCOUNTER — Ambulatory Visit (INDEPENDENT_AMBULATORY_CARE_PROVIDER_SITE_OTHER): Payer: Self-pay | Admitting: Internal Medicine

## 2016-08-23 ENCOUNTER — Encounter: Payer: Self-pay | Admitting: Internal Medicine

## 2016-08-23 VITALS — BP 102/68 | HR 70 | Resp 12 | Ht 60.0 in | Wt 186.0 lb

## 2016-08-23 DIAGNOSIS — E118 Type 2 diabetes mellitus with unspecified complications: Secondary | ICD-10-CM

## 2016-08-23 DIAGNOSIS — E782 Mixed hyperlipidemia: Secondary | ICD-10-CM

## 2016-08-23 DIAGNOSIS — M722 Plantar fascial fibromatosis: Secondary | ICD-10-CM

## 2016-08-23 MED ORDER — ATORVASTATIN CALCIUM 20 MG PO TABS: 20.0000 mg | ORAL_TABLET | Freq: Every day | ORAL | 11 refills | Status: DC

## 2016-08-23 NOTE — Progress Notes (Signed)
   Subjective:    Patient ID: Kelsey Keith, female    DOB: [birth year], 41 y.o.   MRN: [id number]  HPI   1.  DM:  Checking sugars and generally in low 100s.  Brings in her journal with good improvement of sugars. Discussed Diabetes and why we recommend controlling sugars now and in the future.  No eye check in past year. Has not had flu or pneumococcal vaccine.   2.  Right heel pain:  Worse after sitting for a time or getting up in the morning.  Does have hard floors.  Does not go barefoot much, but does wear flip flops a lot.    3.  Chest Pain :  Has improved.  Has not been to see Cardiology yet.    4.  Hyperlipidemia  Lipid Panel     Component Value Date/Time   CHOL 241 (H) 07/27/2016 1010   TRIG 135 07/27/2016 1010   HDL 48 07/27/2016 1010   CHOLHDL 5.0 (H) 07/27/2016 1010   CHOLHDL 5.2 Ratio 08/13/2007 2049   VLDL 33 08/13/2007 2049   LDLCALC 166 (H) 07/27/2016 1010  History of BTL  Current Meds  Medication Sig  . Blood Glucose Monitoring Suppl (AGAMATRIX PRESTO) w/Device KIT Check sugars twice daily before meals  . glucose blood (AGAMATRIX PRESTO TEST) test strip Check sugars twice daily before meals  . metFORMIN (GLUCOPHAGE-XR) 500 MG 24 hr tablet Take 1 tablet (500 mg total) by mouth daily with breakfast.    No Known Allergies  Review of Systems    . Objective:   Physical Exam   Lungs:  CTA CV: RRR with normal S1 and S2, no S3, S4 or murmur, radial pulses normal and equal LE: No edema Right plantar heel without lesion, but quite tender over plantar heel.    Diabetic Foot Exam - Simple   Simple Foot Form Diabetic Foot exam was performed with the following findings:  Yes 08/23/2016  3:37 PM  Visual Inspection No deformities, no ulcerations, no other skin breakdown bilaterally:  Yes Sensation Testing Intact to touch and monofilament testing bilaterally:  Yes Pulse Check Posterior Tibialis and Dorsalis pulse intact bilaterally:  Yes Comments            Assessment & Plan:  1. DM:  Doing well with lifestyle changes:  CPM Eye referral Pneumovax and possibly Tdap next visit--immunizations still at another facility following loss of power A1C in 3 months with follow up with me thereafter.  2.  Hyperlipidemia: Atorvastatin 20 mg daily.  Follow up for fasting lipids and hepatic profile in 6 weeks.  3.  Chest Pain:  Await cardiology referral.  4.  Right plantar fasciitis:  Discussed exercises.  Ibuprofen 400-600 twice daily with food for 14 days. Heel cups.  No flip flops or mules.

## 2016-09-14 ENCOUNTER — Ambulatory Visit (INDEPENDENT_AMBULATORY_CARE_PROVIDER_SITE_OTHER): Payer: No Typology Code available for payment source | Admitting: Internal Medicine

## 2016-09-14 ENCOUNTER — Encounter: Payer: Self-pay | Admitting: Internal Medicine

## 2016-09-14 ENCOUNTER — Encounter (INDEPENDENT_AMBULATORY_CARE_PROVIDER_SITE_OTHER): Payer: Self-pay

## 2016-09-14 VITALS — BP 118/66 | HR 80 | Ht 60.0 in | Wt 184.8 lb

## 2016-09-14 DIAGNOSIS — R072 Precordial pain: Secondary | ICD-10-CM

## 2016-09-14 DIAGNOSIS — E785 Hyperlipidemia, unspecified: Secondary | ICD-10-CM

## 2016-09-14 NOTE — Patient Instructions (Addendum)
Your physician recommends that you continue on your current medications as directed. Please refer to the Current Medication list given to you today. Your physician recommends that you schedule a follow-up appointment as needed with Dr. Ross.   

## 2016-09-14 NOTE — Progress Notes (Signed)
Cardiology Office Note   Date:  09/14/2016   ID:  Kelsey Keith, DOB 1975/08/08, MRN [id number]  PCP:  Mack Hook, MD  Cardiologist:   Dorris Carnes, MD    Pt referred by Dr Amil Amen for eval of CP    History of Present Illness: Kelsey Keith is a 41 y.o. female with a history of DM, HL and CP  Followed by Dr Amil Amen Pt was seen in March 2018  COmplained of pinchin in chest  Needle like  Not asociated with activity.  Last 20 seconds  Sop what doing  Would go away Start back on activity more slowlyAlos complained of chest pressure  With walking or exertion    Mother with history of CAD  Foather with history of CVA    Pt was just seen on 5/3 by Dr Amil Amen  Symptoms had improved       Current Meds  Medication Sig  . atorvastatin (LIPITOR) 20 MG tablet Take 1 tablet (20 mg total) by mouth daily.  . Blood Glucose Monitoring Suppl (AGAMATRIX PRESTO) w/Device KIT Check sugars twice daily before meals  . glucose blood (AGAMATRIX PRESTO TEST) test strip Check sugars twice daily before meals  . metFORMIN (GLUCOPHAGE-XR) 500 MG 24 hr tablet Take 1 tablet (500 mg total) by mouth daily with breakfast.  . nitroGLYCERIN (NITROSTAT) 0.4 MG SL tablet Place 1 tablet (0.4 mg total) under the tongue every 5 (five) minutes as needed for chest pain. Max of 3 dosing for 1 episode     Allergies:   Patient has no known allergies.   Past Medical History:  Diagnosis Date  . Depression age 70 yo and 2002   First episode age 62 yo, then another after child born 2002.  Was treated for 5 years then with Zoloft.  Has not needed treatment since 2007.  Marland Kitchen DM type 2 (diabetes mellitus, type 2) ([place]) 07/26/2016  . Hyperlipidemia 2009  . Obesity 07/09/2016    Past Surgical History:  Procedure Laterality Date  . [place], 2002,2012  . TUBAL LIGATION  2012     Social History:  The patient  reports that she quit smoking about 4 years ago. Her smoking use  included Cigarettes. She has a 20.00 pack-year smoking history. She has never used smokeless tobacco. She reports that she does not drink alcohol or use drugs.   Family History:  The patient's family history includes Diabetes in her brother; Heart disease in her father and mother; Hypertension in her mother; Stroke in her father and mother.    ROS:  Please see the history of present illness. All other systems are reviewed and  Negative to the above problem except as noted.    PHYSICAL EXAM: VS:  BP 118/66   Pulse 80   Ht 5' (1.524 m)   Wt 184 lb 12.8 oz (83.8 kg)   LMP 08/20/2016 (Exact Date)   SpO2 98%   BMI 36.09 kg/m   GEN: Well nourished, well developed, in no acute distress  HEENT: normal  Neck: no JVD, carotid bruits, or masses Cardiac: RRR; no murmurs, rubs, or gallops,no edema  Respiratory:  clear to auscultation bilaterally, normal work of breathing GI: soft, nontender, nondistended, + BS  No hepatomegaly  MS: no deformity Moving all extremities   Skin: warm and dry, no rash Neuro:  Strength and sensation are intact Psych: euthymic mood, full affect   EKG:  EKG is ordered today.   Lipid Panel  Component Value Date/Time   CHOL 241 (H) 07/27/2016 1010   TRIG 135 07/27/2016 1010   HDL 48 07/27/2016 1010   CHOLHDL 5.0 (H) 07/27/2016 1010   CHOLHDL 5.2 Ratio 08/13/2007 2049   VLDL 33 08/13/2007 2049   LDLCALC 166 (H) 07/27/2016 1010      Wt Readings from Last 3 Encounters:  09/14/16 184 lb 12.8 oz (83.8 kg)  08/23/16 186 lb (84.4 kg)  07/27/16 192 lb (87.1 kg)      ASSESSMENT AND PLAN:  1  Chest discomfort  I do not think discomfort is cardiac  Atypical    2  Hyperlipidemia  Pt recently started on Lipitor  I agree with this  LDL was 145  I would treat aggressively, lower to 70  3  DM    4  HCM  Encouraged exercise, wt lose    WIll be available as needed in future     Current medicines are reviewed at length with the patient today.  The patient  does not have concerns regarding medicines.  Signed, Dorris Carnes, MD  09/14/2016 9:53 AM    [address], [place], [place]  [address] Phone: [phone]; Fax: [phone]

## 2016-09-17 NOTE — Progress Notes (Signed)
   Subjective:    Patient ID: Kelsey Keith, Kelsey Keith    DOB: 11/28/1975, 41 y.o.   MRN: [id number]  HPI   Newly diagnosed DM:  Recent A1C of 10.5%.  Diet fair.  Here to discuss diabetes, lifestyle changes with diet and physical activity and to initiate medication.  No outpatient prescriptions have been marked as taking for the 07/26/16 encounter (Office Visit) with Kelsey Keith, Kelsey Astorino, MD.    No Known Allergies    Review of Systems     Objective:   Physical Exam NAD Lungs:  CTA CV:  RRR without murmur or rub, radial pulses normal and equal LE:  No edema       Assessment & Plan:  1.  Newly diagnosed DM:   To work on diet and physical activity, the latter more so after evaluation by cardiology for chest pain Metformin ER 500 mg with breakfast  Glucose journal Glucometer and test strips. Return in 1 month to better discuss complications of DM.  Limited interpretation today.

## 2016-10-03 ENCOUNTER — Ambulatory Visit: Payer: Self-pay | Admitting: Interventional Cardiology

## 2016-10-08 ENCOUNTER — Other Ambulatory Visit (INDEPENDENT_AMBULATORY_CARE_PROVIDER_SITE_OTHER): Payer: Self-pay

## 2016-10-08 DIAGNOSIS — E119 Type 2 diabetes mellitus without complications: Secondary | ICD-10-CM

## 2016-10-08 DIAGNOSIS — Z79899 Other long term (current) drug therapy: Secondary | ICD-10-CM

## 2016-10-08 DIAGNOSIS — E782 Mixed hyperlipidemia: Secondary | ICD-10-CM

## 2016-10-09 LAB — HEPATIC FUNCTION PANEL
ALT: 12 IU/L (ref 0–32)
AST: 13 IU/L (ref 0–40)
Albumin: 4 g/dL (ref 3.5–5.5)
Alkaline Phosphatase: 66 IU/L (ref 39–117)
Bilirubin Total: 0.5 mg/dL (ref 0.0–1.2)
Bilirubin, Direct: 0.12 mg/dL (ref 0.00–0.40)
Total Protein: 6.6 g/dL (ref 6.0–8.5)

## 2016-10-09 LAB — LIPID PANEL W/O CHOL/HDL RATIO
Cholesterol, Total: 140 mg/dL (ref 100–199)
HDL: 48 mg/dL (ref >39)
LDL Calculated: 75 mg/dL (ref 0–99)
Triglycerides: 86 mg/dL (ref 0–149)
VLDL Cholesterol Cal: 17 mg/dL (ref 5–40)

## 2016-10-09 LAB — HGB A1C W/O EAG: HEMOGLOBIN A1C: 6.8 % — AB (ref 4.8–5.6)

## 2016-10-11 ENCOUNTER — Encounter: Payer: Self-pay | Admitting: Internal Medicine

## 2016-10-11 ENCOUNTER — Ambulatory Visit (INDEPENDENT_AMBULATORY_CARE_PROVIDER_SITE_OTHER): Payer: Self-pay | Admitting: Internal Medicine

## 2016-10-11 VITALS — BP 112/72 | HR 70 | Resp 12 | Ht 60.0 in | Wt 185.0 lb

## 2016-10-11 DIAGNOSIS — H11153 Pinguecula, bilateral: Secondary | ICD-10-CM

## 2016-10-11 DIAGNOSIS — E118 Type 2 diabetes mellitus with unspecified complications: Secondary | ICD-10-CM

## 2016-10-11 DIAGNOSIS — R079 Chest pain, unspecified: Secondary | ICD-10-CM | POA: Insufficient documentation

## 2016-10-11 DIAGNOSIS — E782 Mixed hyperlipidemia: Secondary | ICD-10-CM

## 2016-10-11 DIAGNOSIS — Z23 Encounter for immunization: Secondary | ICD-10-CM

## 2016-10-11 HISTORY — DX: Pinguecula, bilateral: H11.153

## 2016-10-11 NOTE — Progress Notes (Signed)
   Subjective:    Patient ID: Kelsey Keith, female    DOB: October 14, 1975, 41 y.o.   MRN: [id number]  HPI   1.  DM:  Has decreased Metformin ER 500 mg to once daily as sugars were just as well controlled.  A1C dropped over 2 months from 10.8% to 6.8%.  Discussed this is phenomenal change in a little over 2 months.  Sugars generally below 150 in her journal.    2.  Hyperlipidemia:  Since starting Atorvastatin, cholesterol panel essentially at goal. Lipid Panel     Component Value Date/Time   CHOL 140 10/08/2016 0953   TRIG 86 10/08/2016 0953   TRIG 135 07/27/2016 1010   HDL 48 10/08/2016 0953   CHOLHDL 5.0 (H) 07/27/2016 1010   CHOLHDL 5.2 Ratio 08/13/2007 2049   VLDL 33 08/13/2007 2049   LDLCALC 75 10/08/2016 0953    3  Chest Pain:  Cardiology felt not cardiac related.  To get DM under control and if still with CP possibly with cardiac etiology, will reevaluate.  4.  Inflammation of localized temporal eyes recently, not involving irises that has resolved and now with something where inflammation was.  Not aware of an abnormality prior to the redness.  Current Meds  Medication Sig  . atorvastatin (LIPITOR) 20 MG tablet Take 1 tablet (20 mg total) by mouth daily.  . Blood Glucose Monitoring Suppl (AGAMATRIX PRESTO) w/Device KIT Check sugars twice daily before meals  . glucose blood (AGAMATRIX PRESTO TEST) test strip Check sugars twice daily before meals  . metFORMIN (GLUCOPHAGE-XR) 500 MG 24 hr tablet Take 1 tablet (500 mg total) by mouth daily with breakfast.   No Known Allergies     Review of Systems     Objective:   Physical Exam  NAD HEENT:  Glistening slightly yellow rests on temporal bulbar conjunctivae bilaterally.  Does not appear to involve irises.  Minimal redness within the rests. Lungs:  CTA CV:  RRR without murmur or rub, radial pulses normal and equal LE:  No edema.        Assessment & Plan:  1.  DM:  Incredible improvement in control.   Encouraged to get started more on physical activity.  To call if develops CP.  Okay to take Metformin ER only once daily as long as sugars remain controlled.  Repeat A1C in 3 months and followup thereafter.  2.  Hyperlipidemia:  Basically at goal with Atorvastatin.  Recheck in 6 months with hepatic profile.  3.  Pinguecula:  Has optometry referral for decreased visual acuity.  To use sunglasses when out in sun.  Lubricating drops when inflammed.  4.  CP:  No symptoms currently.  5.  HM:  Tdap today. Addendum:  Inadvertently, did not get Tdap.  Patient called and will come in in coming days to receive.

## 2017-01-09 ENCOUNTER — Other Ambulatory Visit: Payer: Self-pay

## 2017-01-11 ENCOUNTER — Other Ambulatory Visit (INDEPENDENT_AMBULATORY_CARE_PROVIDER_SITE_OTHER): Payer: Self-pay

## 2017-01-11 ENCOUNTER — Ambulatory Visit: Payer: Self-pay | Admitting: Internal Medicine

## 2017-01-11 DIAGNOSIS — E119 Type 2 diabetes mellitus without complications: Secondary | ICD-10-CM

## 2017-01-12 LAB — HGB A1C W/O EAG: Hgb A1c MFr Bld: 6.7 % — ABNORMAL HIGH (ref 4.8–5.6)

## 2017-01-17 ENCOUNTER — Encounter: Payer: Self-pay | Admitting: Internal Medicine

## 2017-01-17 ENCOUNTER — Ambulatory Visit (INDEPENDENT_AMBULATORY_CARE_PROVIDER_SITE_OTHER): Payer: Self-pay | Admitting: Internal Medicine

## 2017-01-17 VITALS — BP 112/78 | HR 72 | Resp 12 | Ht 60.0 in | Wt 182.0 lb

## 2017-01-17 DIAGNOSIS — E118 Type 2 diabetes mellitus with unspecified complications: Secondary | ICD-10-CM

## 2017-01-17 DIAGNOSIS — E782 Mixed hyperlipidemia: Secondary | ICD-10-CM

## 2017-01-17 DIAGNOSIS — E6609 Other obesity due to excess calories: Secondary | ICD-10-CM

## 2017-01-17 DIAGNOSIS — Z6836 Body mass index (BMI) 36.0-36.9, adult: Secondary | ICD-10-CM

## 2017-01-17 LAB — GLUCOSE, POCT (MANUAL RESULT ENTRY): POC GLUCOSE: 98 mg/dL (ref 70–99)

## 2017-01-17 NOTE — Progress Notes (Signed)
   Subjective:    Patient ID: Kelsey Keith, female    DOB: Jan 25, 1976, 41 y.o.   MRN: [id number]  HPI  1.  DM:  Was walking regularly, but nothing in past week as so busy.  Is only taking Metformin ER 500 mg since last visit end of June. Has not had influenza vaccine yet this season. No pneumovax yet.   A1C is now down to 6.7% Checking feet nightly before goes to bed. Had eye check, but was told her glasses would cost $285 at [place] [facility] [place] [facility]  2.  Pinguecula:  Inflammation resolved without treatment.  Current Meds  Medication Sig  . atorvastatin (LIPITOR) 20 MG tablet Take 1 tablet (20 mg total) by mouth daily.  . Blood Glucose Monitoring Suppl (AGAMATRIX PRESTO) w/Device KIT Check sugars twice daily before meals  . glucose blood (AGAMATRIX PRESTO TEST) test strip Check sugars twice daily before meals  . metFORMIN (GLUCOPHAGE-XR) 500 MG 24 hr tablet Take 1 tablet (500 mg total) by mouth daily with breakfast.    No Known Allergies   Review of Systems     Objective:   Physical Exam   NAD Lungs:  CTA CV: RRR without murmur or rub LE:  No edema        Assessment & Plan:  1.  DM:  Great control on low dose Metformin.  Continue to work on lifestyle changes for weight loss.  2.  Hyperlipidemia:  Continue Atorvastatin.  FLP and hepatic profile in December.  Add HIV and urine microalbumin/crea then as well.  3.  HM:  Up to date on eye exam --need to check on voucher--call into [facility] situation.  Up to date on Tdap.  Needs influenza this Saturday with [place]. Will give Pneumococcal vaccine with lab visit in December--out currently.

## 2017-01-21 ENCOUNTER — Telehealth: Payer: Self-pay | Admitting: Internal Medicine

## 2017-01-21 DIAGNOSIS — Z9189 Other specified personal risk factors, not elsewhere classified: Secondary | ICD-10-CM

## 2017-01-21 NOTE — Telephone Encounter (Signed)
To Dr. Mulberry  

## 2017-01-21 NOTE — Telephone Encounter (Signed)
Patient would like referral for dentist.  Dr. Delrae Alfred asked for name and birth date in order to prepare a referral to Dentist.

## 2017-01-22 NOTE — Telephone Encounter (Signed)
done

## 2017-02-08 ENCOUNTER — Telehealth (HOSPITAL_COMMUNITY): Payer: Self-pay

## 2017-02-08 NOTE — Telephone Encounter (Signed)
WISEWOMAN Follow Up  Interpreter: Kelsey RoyalsJulie Sowell  Medication: Patient takes medication for hyperlipidemia and diabetes. She does not have HTN.   Blood Pressure, Self Measurement: N/A  Nutrition Assessment: Patient states that she eats 1/2 cup of fruit daily. She eats 1 1/2 cups of vegetables daily. She does not eat 2 or more servings of fish weekly. Patient eats more than 3oz of whole grains daily. She drinks less than 36oz of sugar added beverages weekly and watches her sodium intake.   Physical Activity: Patient states that she gets 30-60 minutes of moderate and vigorous physical activity weekly.   Smoking Status: Patient quit smoking in 2014.   Quality of life: Patient denies any mental/physical health days that keep her from doing her normal activities.   Navigation: Patient states that she would like to renew her WISEWOMAN when she is eligible.

## 2017-02-11 ENCOUNTER — Encounter (HOSPITAL_COMMUNITY): Payer: Self-pay

## 2017-04-11 ENCOUNTER — Other Ambulatory Visit: Payer: Self-pay

## 2017-04-11 DIAGNOSIS — E785 Hyperlipidemia, unspecified: Secondary | ICD-10-CM

## 2017-04-11 DIAGNOSIS — E118 Type 2 diabetes mellitus with unspecified complications: Secondary | ICD-10-CM

## 2017-04-11 DIAGNOSIS — Z79899 Other long term (current) drug therapy: Secondary | ICD-10-CM

## 2017-04-11 DIAGNOSIS — Z7251 High risk heterosexual behavior: Secondary | ICD-10-CM

## 2017-04-12 LAB — LIPID PANEL W/O CHOL/HDL RATIO
CHOLESTEROL TOTAL: 156 mg/dL (ref 100–199)
HDL: 51 mg/dL (ref >39)
LDL CALC: 92 mg/dL (ref 0–99)
Triglycerides: 65 mg/dL (ref 0–149)
VLDL CHOLESTEROL CAL: 13 mg/dL (ref 5–40)

## 2017-04-12 LAB — HEPATIC FUNCTION PANEL
ALBUMIN: 3.8 g/dL (ref 3.5–5.5)
ALT: 18 IU/L (ref 0–32)
AST: 17 IU/L (ref 0–40)
Alkaline Phosphatase: 71 IU/L (ref 39–117)
BILIRUBIN TOTAL: 0.4 mg/dL (ref 0.0–1.2)
Bilirubin, Direct: 0.1 mg/dL (ref 0.00–0.40)
Total Protein: 6.8 g/dL (ref 6.0–8.5)

## 2017-04-12 LAB — MICROALBUMIN / CREATININE URINE RATIO
Creatinine, Urine: 87.4 mg/dL
MICROALB/CREAT RATIO: 247.1 mg/g{creat} — AB (ref 0.0–30.0)
MICROALBUM., U, RANDOM: 216 ug/mL

## 2017-04-12 LAB — HIV ANTIBODY (ROUTINE TESTING W REFLEX): HIV Screen 4th Generation wRfx: NONREACTIVE

## 2017-05-07 NOTE — Telephone Encounter (Signed)
Error

## 2017-05-24 ENCOUNTER — Other Ambulatory Visit: Payer: Self-pay

## 2017-05-24 DIAGNOSIS — E782 Mixed hyperlipidemia: Secondary | ICD-10-CM

## 2017-05-24 DIAGNOSIS — Z79899 Other long term (current) drug therapy: Secondary | ICD-10-CM

## 2017-05-25 LAB — LIPID PANEL W/O CHOL/HDL RATIO
Cholesterol, Total: 192 mg/dL (ref 100–199)
HDL: 54 mg/dL (ref >39)
LDL Calculated: 121 mg/dL — ABNORMAL HIGH (ref 0–99)
Triglycerides: 86 mg/dL (ref 0–149)
VLDL Cholesterol Cal: 17 mg/dL (ref 5–40)

## 2017-05-25 LAB — HEPATIC FUNCTION PANEL
ALBUMIN: 3.9 g/dL (ref 3.5–5.5)
ALK PHOS: 62 IU/L (ref 39–117)
ALT: 15 IU/L (ref 0–32)
AST: 12 IU/L (ref 0–40)
BILIRUBIN TOTAL: 0.5 mg/dL (ref 0.0–1.2)
Bilirubin, Direct: 0.12 mg/dL (ref 0.00–0.40)
TOTAL PROTEIN: 6.8 g/dL (ref 6.0–8.5)

## 2017-07-24 ENCOUNTER — Other Ambulatory Visit: Payer: Self-pay | Admitting: Obstetrics and Gynecology

## 2017-07-24 DIAGNOSIS — Z1231 Encounter for screening mammogram for malignant neoplasm of breast: Secondary | ICD-10-CM

## 2017-08-01 ENCOUNTER — Ambulatory Visit: Payer: Self-pay | Admitting: Internal Medicine

## 2017-08-01 ENCOUNTER — Encounter: Payer: Self-pay | Admitting: Internal Medicine

## 2017-08-01 VITALS — BP 112/78 | HR 78 | Resp 12 | Ht 60.0 in | Wt 186.0 lb

## 2017-08-01 DIAGNOSIS — Z0000 Encounter for general adult medical examination without abnormal findings: Secondary | ICD-10-CM

## 2017-08-01 DIAGNOSIS — Z23 Encounter for immunization: Secondary | ICD-10-CM

## 2017-08-01 MED ORDER — FEXOFENADINE HCL 180 MG PO TABS: 180.0000 mg | ORAL_TABLET | Freq: Every day | ORAL | Status: DC

## 2017-08-01 NOTE — Progress Notes (Signed)
Subjective:    Patient ID: Kelsey Keith, female    DOB: 04/11/1976, 42 y.o.   MRN: [id number]  HPI   CPE with pap  1.  Pap:  States had pap smear through screening  April of 2018 at [facility] [place].  States received normal result.  Never with abnormal Pap.  No family history of cervical cancer. Later, found documentation of pap smear via free screening with BCCCP 06/18/16 Result scanned into Pap results in HM  2.  Mammogram:  Mammogram scheduled for later this month.  Normal last year.  No family history of breast cancer.  3.  Osteoprevention:  Drinks 1 cup 2 % milk daily.  Not physically active currently.  Was walking one hour weekly.  4.  Guaiac Cards:  Never.    5.  Colonoscopy: Maybe had colonoscopy, though thought this was done at [facility] after 2012.  She may have a report at home.  Nothing in her chart regarding this. No family history of colon cancer.  6.  Immunizations: Immunization History  Administered Date(s) Administered  . Tdap 03/16/2014   Did not get flu vaccine this year.  7.  Glucose/Cholesterol:  Last A1C was 6.7% in September.  Has known DM.  Cholesterol--missed medication in December before she had her cholesterol checked in February.  She was not at goal in Feb with high LDL.   Lipid Panel     Component Value Date/Time   CHOL 192 05/24/2017 0919   TRIG 86 05/24/2017 0919   TRIG 135 07/27/2016 1010   HDL 54 05/24/2017 0919   CHOLHDL 5.0 (H) 07/27/2016 1010   CHOLHDL 5.2 Ratio 08/13/2007 2049   VLDL 33 08/13/2007 2049   LDLCALC 121 (H) 05/24/2017 0919   Current Meds  Medication Sig  . atorvastatin (LIPITOR) 20 MG tablet Take 1 tablet (20 mg total) by mouth daily.  . Blood Glucose Monitoring Suppl (AGAMATRIX PRESTO) w/Device KIT Check sugars twice daily before meals  . glucose blood (AGAMATRIX PRESTO TEST) test strip Check sugars twice daily before meals  . metFORMIN (GLUCOPHAGE-XR) 500 MG 24 hr tablet Take 1 tablet (500 mg total) by  mouth daily with breakfast.    No Known Allergies   Review of Systems  Constitutional: Negative for appetite change, fatigue and fever.  HENT: Positive for rhinorrhea, sinus pain and sneezing. Negative for hearing loss.        Headache frontal and ear irritation and runny nose since pollen started.  Eyes: Positive for itching.  Respiratory: Negative for cough and shortness of breath.   Cardiovascular: Negative for chest pain, palpitations and leg swelling.  Gastrointestinal: Negative for abdominal pain, blood in stool (no melena), constipation and diarrhea.  Endocrine: Negative for polydipsia and polyuria.  Genitourinary: Negative for dysuria, frequency, menstrual problem, pelvic pain and vaginal discharge.  Musculoskeletal: Negative for arthralgias.  Neurological: Negative for weakness and numbness.  Psychiatric/Behavioral: Negative for dysphoric mood. The patient is not nervous/anxious.        Objective:   Physical Exam  Constitutional: She is oriented to person, place, and time. She appears well-developed and well-nourished.  HENT:  Head: Normocephalic and atraumatic.  Right Ear: Hearing, tympanic membrane, external ear and ear canal normal.  Left Ear: Hearing, tympanic membrane, external ear and ear canal normal.  Nose: Mucosal edema present.  Mouth/Throat: Uvula is midline, oropharynx is clear and moist and mucous membranes are normal.  Eyes: Pupils are equal, round, and reactive to light. Conjunctivae and EOM are normal.  Discs sharp bilaterally  Neck: Normal range of motion and full passive range of motion without pain. Neck supple. No thyromegaly present.  Cardiovascular: Normal rate, regular rhythm, S1 normal and S2 normal. Exam reveals no S3, no S4 and no friction rub.  No murmur heard. No carotid bruits.  Carotid, radial, femoral, DP and PT pulses normal and equal.   Pulmonary/Chest: Effort normal and breath sounds normal. Right breast exhibits no inverted nipple, no  mass, no nipple discharge, no skin change and no tenderness. Left breast exhibits no inverted nipple, no mass, no nipple discharge, no skin change and no tenderness.  Abdominal: Soft. Bowel sounds are normal. She exhibits no mass. There is no hepatosplenomegaly. There is no tenderness. No hernia.  Genitourinary:  Genitourinary Comments: Normal external genitalia.  No uterine or adnexal mass or tenderness. Rectal exam:  No mass, heme negative light brown stool.  Musculoskeletal: Normal range of motion.  Lymphadenopathy:       Head (right side): No submental and no submandibular adenopathy present.       Head (left side): No submental adenopathy present.    She has no cervical adenopathy.    She has no axillary adenopathy.       Right: No inguinal and no supraclavicular adenopathy present.       Left: No inguinal and no supraclavicular adenopathy present.  Neurological: She is alert and oriented to person, place, and time. She has normal strength and normal reflexes. No cranial nerve deficit or sensory deficit. Coordination and gait normal.  Skin: Skin is warm and dry. No rash noted.  Psychiatric: She has a normal mood and affect. Her speech is normal and behavior is normal. Judgment and thought content normal. Cognition and memory are normal.        Assessment & Plan:  1.  CPE without pap.   Pap next due in 2021. Mammogram scheduled  Return Guaiac cards x 3 in 2 weeks with fasting labs Labs in 2 weeks:  FLP, A1C, CMP Pneumococcal 23 v vaccine today. Influenza recommended for the fall--to call in September Check on voucher for eyeglasses and what happened last year  2.  Seasonal Allergies:  Fexofenadine 180 mg daily.

## 2017-08-13 ENCOUNTER — Ambulatory Visit (HOSPITAL_COMMUNITY)
Admission: RE | Admit: 2017-08-13 | Discharge: 2017-08-13 | Disposition: A | Discharge status: Home / Self Care | Payer: Self-pay | Source: Ambulatory Visit | Attending: Obstetrics and Gynecology | Admitting: Obstetrics and Gynecology

## 2017-08-13 ENCOUNTER — Ambulatory Visit
Admission: RE | Admit: 2017-08-13 | Discharge: 2017-08-13 | Disposition: A | Discharge status: Home / Self Care | Payer: No Typology Code available for payment source | Source: Ambulatory Visit | Attending: Obstetrics and Gynecology | Admitting: Obstetrics and Gynecology

## 2017-08-13 ENCOUNTER — Encounter (HOSPITAL_COMMUNITY): Payer: Self-pay

## 2017-08-13 VITALS — BP 120/72 | Ht 61.0 in | Wt 185.8 lb

## 2017-08-13 DIAGNOSIS — Z1239 Encounter for other screening for malignant neoplasm of breast: Secondary | ICD-10-CM

## 2017-08-13 DIAGNOSIS — Z1231 Encounter for screening mammogram for malignant neoplasm of breast: Secondary | ICD-10-CM

## 2017-08-13 NOTE — Patient Instructions (Signed)
Explained breast self awareness with Kelsey Keith. Patient did not need a Pap smear today due to last Pap smear was 06/18/2016. Let her know BCCCP will cover Pap smears every 3 years unless has a history of abnormal Pap smears. [facility]. Appointment scheduled for Tuesday, August 13, 2017 at 1410. Let patient know the Breast Center will follow up with her within the next couple weeks with results of mammogram by letter or phone. Kelsey Keith verbalized understanding.  Domonic Kimball, Kathaleen Maserhristine Poll, RN 12:56 PM

## 2017-08-13 NOTE — Progress Notes (Signed)
No complaints today.   Pap Smear: Pap smear not completed today. Last Pap smear was 06/18/2016 at the free [facility] cancer screening [facility] and normal. Per patient has no history of an abnormal Pap smear. Last Pap smear result is in Epic.  Physical exam: Breasts Breasts symmetrical. No skin abnormalities bilateral breasts. No nipple retraction bilateral breasts. No nipple discharge bilateral breasts. No lymphadenopathy. No lumps palpated bilateral breasts. No complaints of pain or tenderness on exam. [facility] mamm[facility]. Appointment scheduled for Tuesday, August 13, 2017 at 1410.        Pelvic/Bimanual No Pap smear completed today since last Pap smear was 06/18/2016. Pap smear not indicated per BCCCP guidelines.   Smoking History: Patient is a former smoker that per patient quit 8 years ago.  Patient Navigation: Patient education provided. Access to services provided for patient through [facility]. Spanish interpreter provided.   Breast and Cervical Cancer Risk Assessment: Patient has no family history of breast cancer, known genetic mutations, or radiation treatment to the chest before age 42. Patient has no history of cervical dysplasia, immunocompromised, or DES exposure in-utero.  Used Spanish interpreter [employer]rika McReynolds from [facility].

## 2017-08-14 ENCOUNTER — Encounter (HOSPITAL_COMMUNITY): Payer: Self-pay | Admitting: *Deleted

## 2017-08-19 ENCOUNTER — Other Ambulatory Visit: Payer: Self-pay

## 2017-08-19 DIAGNOSIS — Z79899 Other long term (current) drug therapy: Secondary | ICD-10-CM

## 2017-08-19 DIAGNOSIS — E782 Mixed hyperlipidemia: Secondary | ICD-10-CM

## 2017-08-19 DIAGNOSIS — E118 Type 2 diabetes mellitus with unspecified complications: Secondary | ICD-10-CM

## 2017-08-19 DIAGNOSIS — Z1211 Encounter for screening for malignant neoplasm of colon: Secondary | ICD-10-CM

## 2017-08-19 LAB — POC HEMOCCULT BLD/STL (HOME/3-CARD/SCREEN)
Card #2 Fecal Occult Blod, POC: NEGATIVE
FECAL OCCULT BLD: NEGATIVE
Fecal Occult Blood, POC: NEGATIVE

## 2017-08-20 LAB — COMPREHENSIVE METABOLIC PANEL
ALBUMIN: 3.9 g/dL (ref 3.5–5.5)
ALT: 17 IU/L (ref 0–32)
AST: 17 IU/L (ref 0–40)
Albumin/Globulin Ratio: 1.3 (ref 1.2–2.2)
Alkaline Phosphatase: 73 IU/L (ref 39–117)
BILIRUBIN TOTAL: 0.4 mg/dL (ref 0.0–1.2)
BUN / CREAT RATIO: 18 (ref 9–23)
BUN: 13 mg/dL (ref 6–24)
CHLORIDE: 105 mmol/L (ref 96–106)
CO2: 23 mmol/L (ref 20–29)
CREATININE: 0.73 mg/dL (ref 0.57–1.00)
Calcium: 9 mg/dL (ref 8.7–10.2)
GFR calc Af Amer: 116 mL/min/{1.73_m2} (ref >59)
GFR calc non Af Amer: 100 mL/min/{1.73_m2} (ref >59)
GLUCOSE: 142 mg/dL — AB (ref 65–99)
Globulin, Total: 2.9 g/dL (ref 1.5–4.5)
Potassium: 4.5 mmol/L (ref 3.5–5.2)
Sodium: 139 mmol/L (ref 134–144)
TOTAL PROTEIN: 6.8 g/dL (ref 6.0–8.5)

## 2017-08-20 LAB — LIPID PANEL W/O CHOL/HDL RATIO
Cholesterol, Total: 152 mg/dL (ref 100–199)
HDL: 47 mg/dL (ref >39)
LDL Calculated: 85 mg/dL (ref 0–99)
TRIGLYCERIDES: 102 mg/dL (ref 0–149)
VLDL Cholesterol Cal: 20 mg/dL (ref 5–40)

## 2017-08-20 LAB — HGB A1C W/O EAG: Hgb A1c MFr Bld: 7.1 % — ABNORMAL HIGH (ref 4.8–5.6)

## 2017-08-26 ENCOUNTER — Other Ambulatory Visit: Payer: Self-pay | Admitting: Internal Medicine

## 2017-08-26 ENCOUNTER — Telehealth: Payer: Self-pay | Admitting: Internal Medicine

## 2017-08-26 NOTE — Telephone Encounter (Signed)
Patient needs refill of metFORMIN (GLUCOPHAGE-XR) 500 mg 24 tablet.  Patient needs refill at [facility].

## 2017-08-26 NOTE — Telephone Encounter (Signed)
Patient needs refill of metFORMIN (GLUCOPHAGE-XR) 500 mg tablet.

## 2017-08-26 NOTE — Telephone Encounter (Signed)
Rx was sent to pharmacy already. Pharmacy had sent over a request.

## 2017-09-18 ENCOUNTER — Other Ambulatory Visit: Payer: Self-pay

## 2017-09-18 MED ORDER — ATORVASTATIN CALCIUM 20 MG PO TABS: 20.0000 mg | ORAL_TABLET | Freq: Every day | ORAL | 11 refills | Status: DC

## 2017-11-06 ENCOUNTER — Ambulatory Visit: Payer: Self-pay | Admitting: Internal Medicine

## 2017-11-06 ENCOUNTER — Encounter: Payer: Self-pay | Admitting: Internal Medicine

## 2017-11-06 VITALS — BP 122/82 | HR 72 | Resp 12 | Ht 60.0 in | Wt 190.0 lb

## 2017-11-06 DIAGNOSIS — K047 Periapical abscess without sinus: Secondary | ICD-10-CM

## 2017-11-06 MED ORDER — PENICILLIN V POTASSIUM 500 MG PO TABS: ORAL_TABLET | ORAL | 0 refills | Status: DC

## 2017-11-06 MED ORDER — NAPROXEN 500 MG PO TABS: 500.0000 mg | ORAL_TABLET | Freq: Two times a day (BID) | ORAL | 0 refills | Status: DC

## 2017-11-06 NOTE — Progress Notes (Signed)
   Subjective:    Patient ID: Kelsey Keith, female    DOB: [birth year], 42 y.o.   MRN: [id number]  HPI  Same tooth had problem with back in October.  She has had more pain in past month on and off, but in past week, much worse.  Face on right side hurting, upper jaw.  Pain radiates into head and ear on the right.   No fevers.   Taking Unknown medication out of [place] that is for inflammation and pain--later clarifies it is Naproxen. Helps a little bit. Has taken Ibuprofen, but only 200 mg at a time.  Tylenol as well.  Blood glucoses have been running in low to mid 100s during this time period.  Current Meds  Medication Sig  . atorvastatin (LIPITOR) 20 MG tablet Take 1 tablet (20 mg total) by mouth daily.  . Blood Glucose Monitoring Suppl (AGAMATRIX PRESTO) w/Device KIT Check sugars twice daily before meals  . glucose blood (AGAMATRIX PRESTO TEST) test strip Check sugars twice daily before meals  . metFORMIN (GLUCOPHAGE-XR) 500 MG 24 hr tablet TAKE 1 TABLET BY MOUTH ONCE DAILY WITH BREAKFAST  . nitroGLYCERIN (NITROSTAT) 0.4 MG SL tablet Place 1 tablet (0.4 mg total) under the tongue every 5 (five) minutes as needed for chest pain. Max of 3 dosing for 1 episode    No Known Allergies   Review of Systems     Objective:   Physical Exam NAD HEENT: PERRL, EOMI, TMs pearly gray, throat without injection.  1st and second molar with gingival recession in mild inflammation at gumline.  No definite fluctuance of gingiva, but tender to palpation.  1st molar with silver cap.  2nd molar with large filling. Neck:  Supple, No adenopathy Chest:  CTA CV:  RRR without murmur or rub.  Radial pulses normal and equal       Assessment & Plan:  1.  Abscessed Tooth, right upper molars Penicillin V K 500 mg 4 times daily for 7 days. Naproxen 500 mg twice daily with meals as needed for pain Floss nightly before brushing Urgent dental referral  2.  Right Lumbar radiculopathy  complaint:  Sounds like coming and going.  Discussed this was a work in for the tooth today.  To pay attention to whether this is better with Naproxen or not.  If no better in 4-6 weeks to call.

## 2018-05-13 ENCOUNTER — Telehealth: Payer: Self-pay | Admitting: Internal Medicine

## 2018-05-14 NOTE — Telephone Encounter (Signed)
Patient already has appointment scheduled.

## 2018-05-14 NOTE — Telephone Encounter (Signed)
To Britt Boozer to call patient and schedule appointment

## 2018-06-04 ENCOUNTER — Encounter: Payer: Self-pay | Admitting: Internal Medicine

## 2018-06-04 ENCOUNTER — Ambulatory Visit: Payer: Self-pay | Admitting: Internal Medicine

## 2018-06-04 VITALS — BP 124/72 | HR 78 | Temp 101.7°F | Resp 12 | Ht 60.0 in | Wt 186.0 lb

## 2018-06-04 DIAGNOSIS — R69 Illness, unspecified: Secondary | ICD-10-CM

## 2018-06-04 DIAGNOSIS — J111 Influenza due to unidentified influenza virus with other respiratory manifestations: Secondary | ICD-10-CM

## 2018-06-04 MED ORDER — OSELTAMIVIR PHOSPHATE 75 MG PO CAPS: 75.0000 mg | ORAL_CAPSULE | Freq: Two times a day (BID) | ORAL | 0 refills | Status: DC

## 2018-06-04 NOTE — Patient Instructions (Signed)
Push fluids Cool mist humidifier May use Naproxen or Ibuprofen and Tylenol in between, but not if using cold remedy with tylenol. Dayquil and Nyquil for congestion. (which have Tylenol)

## 2018-06-04 NOTE — Progress Notes (Signed)
    Subjective:    Patient ID: Kelsey Keith, female   DOB: 1975/12/01, 43 y.o.   MRN: [id number]   HPI   Yesterday evening, started with headache, fever up to102 orally, chills, myalgias, particularly of back.  Also with sort throat, congestion and cough.   No abdominal pain, nausea, vomiting or diarrhea.   She is not aware of anyone else around her being ill recently. Did not get flu vaccine this year.    She has taken Tylenol 500 mg last night and again at 4 a.m. this morning.   She does have Naproxen at home, but has not tried.    Sugars running in low 100s.   Current Meds  Medication Sig  . atorvastatin (LIPITOR) 20 MG tablet Take 1 tablet (20 mg total) by mouth daily.  . Blood Glucose Monitoring Suppl (AGAMATRIX PRESTO) w/Device KIT Check sugars twice daily before meals  . glucose blood (AGAMATRIX PRESTO TEST) test strip Check sugars twice daily before meals  . metFORMIN (GLUCOPHAGE-XR) 500 MG 24 hr tablet TAKE 1 TABLET BY MOUTH ONCE DAILY WITH BREAKFAST  . naproxen (NAPROSYN) 500 MG tablet Take 1 tablet (500 mg total) by mouth 2 (two) times daily with a meal.   No Known Allergies   Review of Systems    Objective:   BP 124/72 (BP Location: Left Arm, Patient Position: Sitting, Cuff Size: Normal)   Pulse 78   Temp (!) 101.7 F (38.7 C) (Oral)   Resp 12   Ht 5' (1.524 m)   Wt 186 lb (84.4 kg)   LMP 05/20/2018 (Approximate)   BMI 36.33 kg/m   Physical Exam  Mildly ill appearing HEENT:  PERRL, EOMI, conjunctivae without injection.  TMs pearly gray, throat with mild injection, no exudate.  MMM Neck:  Supple, No adenopathy Chest:  CTA CV:  RRR without murmur or rub.  Brisk cap refill Abd:  S, NT, No HSM or mass, + BS Skin:  No rash   Assessment & Plan   Influenza like viral illness:  Tamiflu 75 mg twice daily for 5 days.   Push fluids Ibuprofen or Naproxen alternating with Tylenol

## 2018-06-06 ENCOUNTER — Other Ambulatory Visit: Payer: Self-pay

## 2018-06-10 ENCOUNTER — Ambulatory Visit: Payer: Self-pay | Admitting: Internal Medicine

## 2018-06-10 ENCOUNTER — Other Ambulatory Visit: Payer: Self-pay

## 2018-06-10 DIAGNOSIS — E782 Mixed hyperlipidemia: Secondary | ICD-10-CM

## 2018-06-10 DIAGNOSIS — E118 Type 2 diabetes mellitus with unspecified complications: Secondary | ICD-10-CM

## 2018-06-10 DIAGNOSIS — Z79899 Other long term (current) drug therapy: Secondary | ICD-10-CM

## 2018-06-11 LAB — COMPREHENSIVE METABOLIC PANEL
A/G RATIO: 1.3 (ref 1.2–2.2)
ALT: 38 IU/L — AB (ref 0–32)
AST: 19 IU/L (ref 0–40)
Albumin: 3.9 g/dL (ref 3.8–4.8)
Alkaline Phosphatase: 53 IU/L (ref 39–117)
BUN/Creatinine Ratio: 15 (ref 9–23)
BUN: 11 mg/dL (ref 6–24)
Bilirubin Total: 0.5 mg/dL (ref 0.0–1.2)
CALCIUM: 9.4 mg/dL (ref 8.7–10.2)
CO2: 26 mmol/L (ref 20–29)
CREATININE: 0.73 mg/dL (ref 0.57–1.00)
Chloride: 107 mmol/L — ABNORMAL HIGH (ref 96–106)
GFR, EST AFRICAN AMERICAN: 115 mL/min/{1.73_m2} (ref >59)
GFR, EST NON AFRICAN AMERICAN: 100 mL/min/{1.73_m2} (ref >59)
Globulin, Total: 3 g/dL (ref 1.5–4.5)
Glucose: 106 mg/dL — ABNORMAL HIGH (ref 65–99)
POTASSIUM: 4.5 mmol/L (ref 3.5–5.2)
Sodium: 145 mmol/L — ABNORMAL HIGH (ref 134–144)
TOTAL PROTEIN: 6.9 g/dL (ref 6.0–8.5)

## 2018-06-11 LAB — CBC WITH DIFFERENTIAL/PLATELET
BASOS: 0 %
Basophils Absolute: 0 10*3/uL (ref 0.0–0.2)
EOS (ABSOLUTE): 0.1 10*3/uL (ref 0.0–0.4)
EOS: 1 %
HEMATOCRIT: 33.6 % — AB (ref 34.0–46.6)
Hemoglobin: 10.3 g/dL — ABNORMAL LOW (ref 11.1–15.9)
IMMATURE GRANS (ABS): 0 10*3/uL (ref 0.0–0.1)
IMMATURE GRANULOCYTES: 0 %
LYMPHS: 50 %
Lymphocytes Absolute: 2.2 10*3/uL (ref 0.7–3.1)
MCH: 22 pg — ABNORMAL LOW (ref 26.6–33.0)
MCHC: 30.7 g/dL — ABNORMAL LOW (ref 31.5–35.7)
MCV: 72 fL — AB (ref 79–97)
Monocytes Absolute: 0.3 10*3/uL (ref 0.1–0.9)
Monocytes: 6 %
NEUTROS PCT: 43 %
Neutrophils Absolute: 2 10*3/uL (ref 1.4–7.0)
PLATELETS: 326 10*3/uL (ref 150–450)
RBC: 4.69 x10E6/uL (ref 3.77–5.28)
RDW: 16.8 % — ABNORMAL HIGH (ref 11.7–15.4)
WBC: 4.6 10*3/uL (ref 3.4–10.8)

## 2018-06-11 LAB — MICROALBUMIN / CREATININE URINE RATIO
Creatinine, Urine: 124.3 mg/dL
Microalb/Creat Ratio: 2 mg/g creat (ref 0–29)
Microalbumin, Urine: 3 ug/mL

## 2018-06-11 LAB — LIPID PANEL W/O CHOL/HDL RATIO
Cholesterol, Total: 167 mg/dL (ref 100–199)
HDL: 47 mg/dL (ref >39)
LDL Calculated: 102 mg/dL — ABNORMAL HIGH (ref 0–99)
Triglycerides: 92 mg/dL (ref 0–149)
VLDL Cholesterol Cal: 18 mg/dL (ref 5–40)

## 2018-06-11 LAB — HGB A1C W/O EAG: HEMOGLOBIN A1C: 7.2 % — AB (ref 4.8–5.6)

## 2018-06-17 ENCOUNTER — Encounter: Payer: Self-pay | Admitting: Internal Medicine

## 2018-06-17 ENCOUNTER — Ambulatory Visit: Payer: Self-pay | Admitting: Internal Medicine

## 2018-06-17 VITALS — BP 128/68 | HR 84 | Resp 12 | Ht 60.0 in | Wt 184.0 lb

## 2018-06-17 DIAGNOSIS — E782 Mixed hyperlipidemia: Secondary | ICD-10-CM

## 2018-06-17 DIAGNOSIS — E118 Type 2 diabetes mellitus with unspecified complications: Secondary | ICD-10-CM

## 2018-06-17 DIAGNOSIS — D509 Iron deficiency anemia, unspecified: Secondary | ICD-10-CM

## 2018-06-17 DIAGNOSIS — J302 Other seasonal allergic rhinitis: Secondary | ICD-10-CM

## 2018-06-17 MED ORDER — CETIRIZINE HCL 10 MG PO TABS: 10.0000 mg | ORAL_TABLET | Freq: Every day | ORAL | 11 refills | Status: DC

## 2018-06-17 MED ORDER — FERROUS GLUCONATE 324 (38 FE) MG PO TABS: ORAL_TABLET | ORAL | 3 refills | Status: DC

## 2018-06-17 NOTE — Progress Notes (Signed)
    Subjective:    Patient ID: Kelsey Keith, female   DOB: [birth year], 43 y.o.   MRN: [id number]   HPI   1.  DM:  Working with a Engineer, maintenance (IT) and "[employer]" guy.  A1C similar to last check in 2019 at 7.2%.  Discussed goal at less than 7.0%. Not really checking sugars often. Encouraged lifestyle changes with diet and regular physical activity she can maintain long term/lifetime.   2.  Elevated LDL:  Rest of cholesterol panel at goal.   Lipid Panel     Component Value Date/Time   CHOL 167 06/10/2018 0842   TRIG 92 06/10/2018 0842   TRIG 135 07/27/2016 1010   HDL 47 06/10/2018 0842   CHOLHDL 5.0 (H) 07/27/2016 1010   CHOLHDL 5.2 Ratio 08/13/2007 2049   VLDL 33 08/13/2007 2049   LDLCALC 102 (H) 06/10/2018 0842        3.  Microcytic Anemia:  Heavy periods.  Has this intermittently.  Showed mild microcytic anemia with hgb at 10.3.   Last Guaiac card testing negative in April of 2019.   No melena or hematochezia.    4.  Allergies:  This is her time of year for symptoms.  Eyes itching.  Sneezing a lot last 3 days.  5.  Left great toenail, medial aspect ingrowing--a bit red and hurts.  Does not cut nails straight across.  Current Meds  Medication Sig  . atorvastatin (LIPITOR) 20 MG tablet Take 1 tablet (20 mg total) by mouth daily.  . Blood Glucose Monitoring Suppl (AGAMATRIX PRESTO) w/Device KIT Check sugars twice daily before meals  . glucose blood (AGAMATRIX PRESTO TEST) test strip Check sugars twice daily before meals  . metFORMIN (GLUCOPHAGE-XR) 500 MG 24 hr tablet TAKE 1 TABLET BY MOUTH ONCE DAILY WITH BREAKFAST   No Known Allergies   Review of Systems    Objective:   BP 128/68 (BP Location: Left Arm, Patient Position: Sitting, Cuff Size: Normal)   Pulse 84   Resp 12   Ht 5' (1.524 m)   Wt 184 lb (83.5 kg)   LMP 05/06/2018   BMI 35.94 kg/m   Physical Exam  NAD HEENT:  PERRL, EOMI, conjunctivae without injection.  TMs pearly gray.   Nasal mucosa boggy with clear discharge.  No posterior pharyngeal cobbling. Neck:  Supple, No adenopathy, no thyromegaly Chest:  CTA CV: RRR without murmur or rub.  Radial and DP pulses normal and equal.   Abd:  S, NT, No HSM or mass, + BS Feet:  Mild pincer deformity of left great toenail.  Side of nail cut deeply.   Assessment & Plan  1.  DM: Prefers to work on lifestyle changes rather than increasing her Metformin XR. Continue Meformin XR 500 mg once daily.    2.  Elevated LDL:  Again working on lifestyle changes rather than increasing Atorvastatin at this point.  3.  Microcytic anemia:  Ferrous gluconate 324 mg daily.    4.  Allergies:  Zyrtec 10 mg daily  5.  Mildly ingrown toenail:  Discussed warm soaks twice daily and cutting nails straight across.  Follow up in 3 months for fasting labs with followup visit with me

## 2018-07-31 IMAGING — DX DG CHEST 2V
2 series · 2 of 2 positions shown · non-contrast
Comparison: None.

CLINICAL DATA: Sharp chest pain

EXAM:
CHEST  2 VIEW

[chest pa]
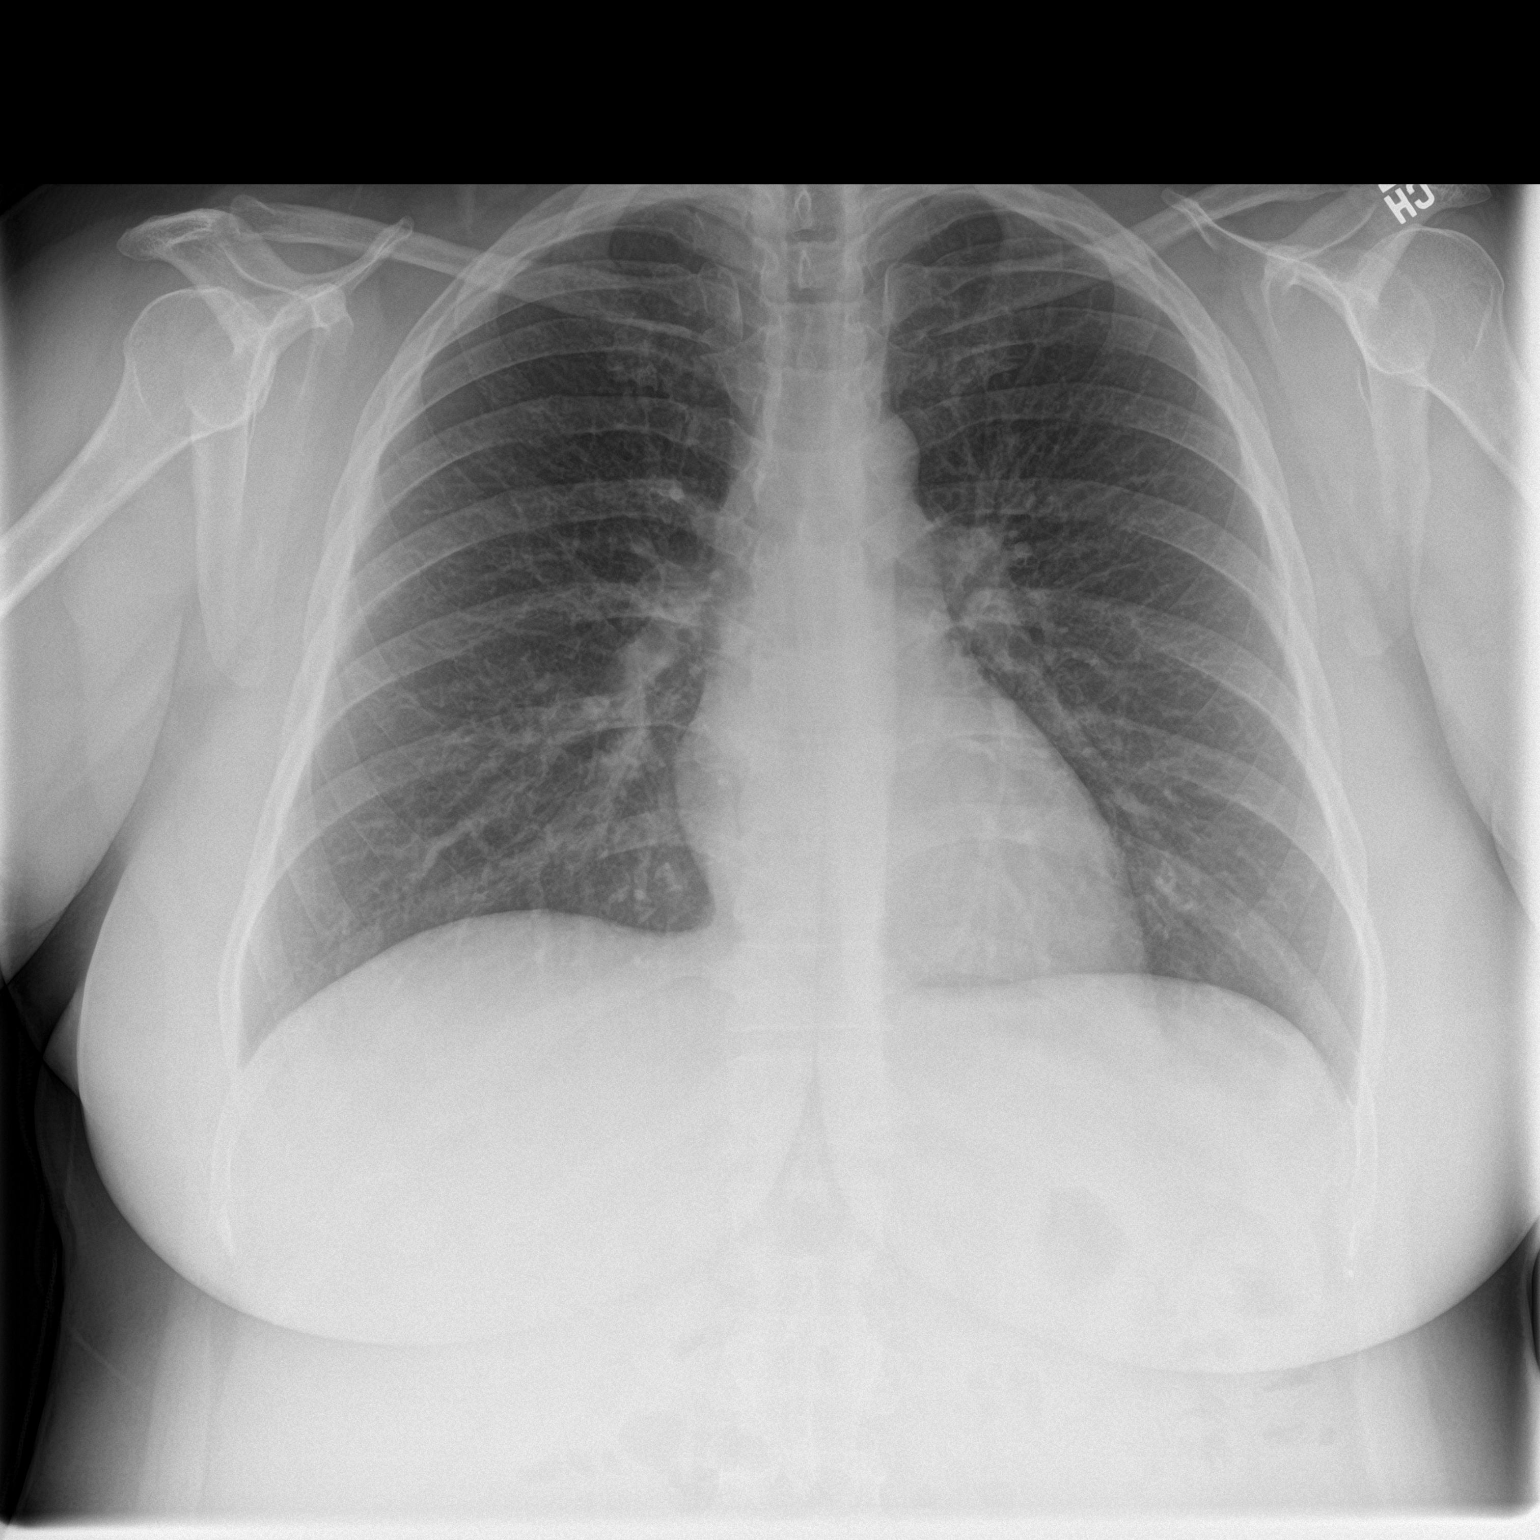

[chest lat]
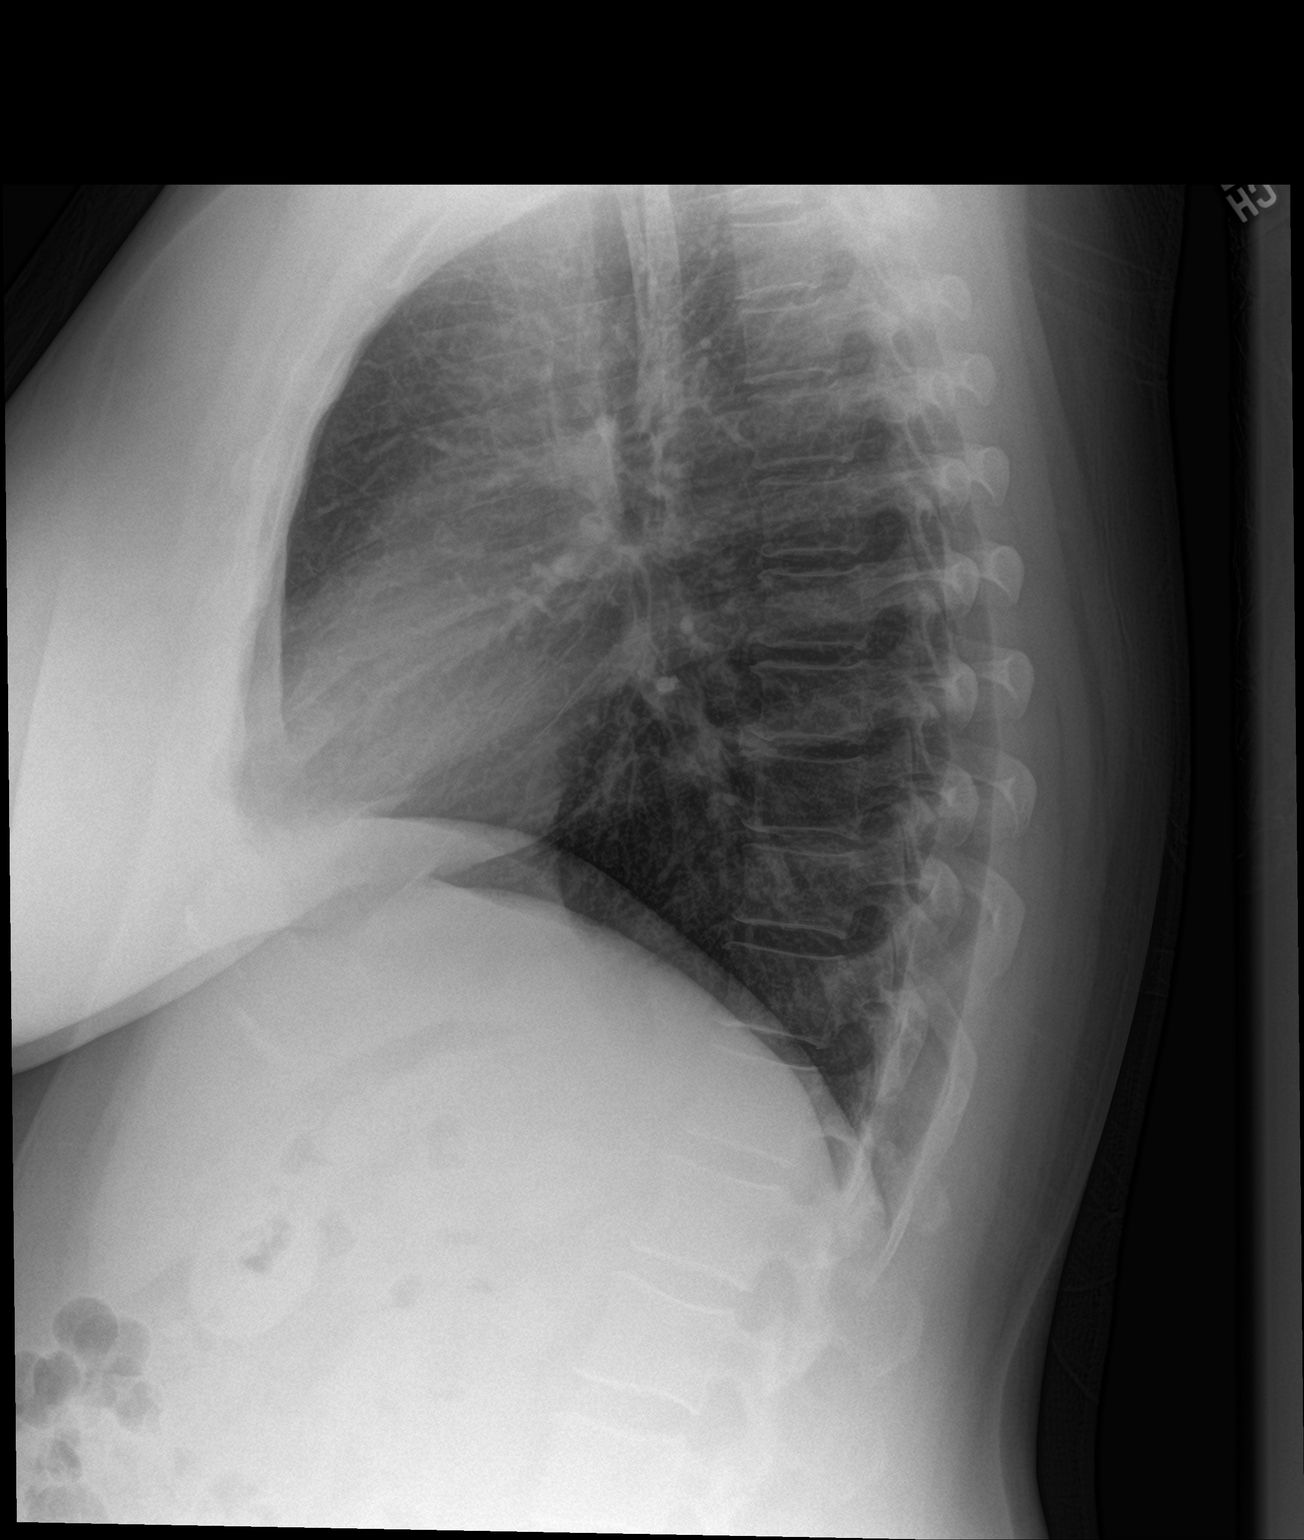

[2 of 2 positions shown; findings below may reference images not displayed]

FINDINGS: The heart size and mediastinal contours are within normal limits.
Both lungs are clear. The visualized skeletal structures are
unremarkable.
IMPRESSION: No active cardiopulmonary disease.

## 2018-08-26 ENCOUNTER — Ambulatory Visit: Payer: Self-pay

## 2018-08-26 ENCOUNTER — Other Ambulatory Visit: Payer: Self-pay

## 2018-08-26 DIAGNOSIS — Z23 Encounter for immunization: Secondary | ICD-10-CM

## 2018-09-04 ENCOUNTER — Other Ambulatory Visit: Payer: Self-pay | Admitting: Internal Medicine

## 2019-06-09 ENCOUNTER — Ambulatory Visit: Payer: No Typology Code available for payment source | Attending: Internal Medicine

## 2019-06-09 DIAGNOSIS — Z20822 Contact with and (suspected) exposure to covid-19: Secondary | ICD-10-CM | POA: Insufficient documentation

## 2019-06-11 LAB — NOVEL CORONAVIRUS, NAA: SARS-CoV-2, NAA: NOT DETECTED

## 2019-08-05 ENCOUNTER — Encounter: Payer: Self-pay | Admitting: Internal Medicine

## 2019-08-05 ENCOUNTER — Ambulatory Visit: Payer: Self-pay | Admitting: Internal Medicine

## 2019-08-05 ENCOUNTER — Other Ambulatory Visit: Payer: Self-pay

## 2019-08-05 VITALS — BP 130/80 | HR 78 | Resp 12 | Ht 60.0 in | Wt 187.0 lb

## 2019-08-05 DIAGNOSIS — B769 Hookworm disease, unspecified: Secondary | ICD-10-CM

## 2019-08-05 DIAGNOSIS — Z6836 Body mass index (BMI) 36.0-36.9, adult: Secondary | ICD-10-CM

## 2019-08-05 DIAGNOSIS — E782 Mixed hyperlipidemia: Secondary | ICD-10-CM

## 2019-08-05 DIAGNOSIS — E118 Type 2 diabetes mellitus with unspecified complications: Secondary | ICD-10-CM

## 2019-08-05 DIAGNOSIS — E6609 Other obesity due to excess calories: Secondary | ICD-10-CM

## 2019-08-05 LAB — GLUCOSE, POCT (MANUAL RESULT ENTRY): POC Glucose: 165 mg/dl — AB (ref 70–99)

## 2019-08-05 NOTE — Progress Notes (Signed)
Subjective:    Patient ID: Kelsey Keith, female   DOB: June 12, 1975, 44 y.o.   MRN: [id number]   HPI   Not seen since 05/2018. Understands English fairly well, though husband, Kelsey Keith, also inteprets.  1.  December 25th, 2020:  Found a bump on her left chest/breast.  Felt like a 1 cm ball under her skin.  The bump swells and gets hot and swollen and red and can get to 2 cm.  She feels that something was moving under her skin and seemed to move to left axillary line of her left thorax and then down to left flank and now is moving upward along her left lateral back.   Every 1.5 weeks, seems like it stops and "balls up" and becomes red and swollen and tender and itches.  When it seems to be moving under her skin, it burns.   Had something like this about 30 years ago.  States a larva of something found in raw fish.  Was given unknown antiparasitic to get rid of it. She and her husband eat ceviche at least twice monthly and feel she may have another parasite because of this--perhaps not appropriately prepared ceviche.+    2.  DM:  States checking sugars twice daily.  Has been taking Metformin ER 500 mg or possibly plain Metformin since last seen over 1 year ago.  Sounds like she misses about half or more of the days per week.   Describes a very healthy meals except for tortillas.   No sodas  3.  Stopped smoking!  4.  Hyperlipidemia:  Has not been on Atorvastatin for so long, she cannot remember when she last took it.   5.  Left leg pain and calf muscle cramps.  Former just started this morning and latter for 2 weeks at night.  Current Meds  Medication Sig  . metFORMIN (GLUCOPHAGE-XR) 500 MG 24 hr tablet Take 1 tablet by mouth once daily with breakfast   No Known Allergies   Review of Systems    Objective:   BP 130/80 (BP Location: Left Arm, Patient Position: Sitting, Cuff Size: Normal)   Pulse 78   Resp 12   Ht 5' (1.524 m)   Wt 187 lb (84.8 kg)   LMP  08/03/2019   BMI 36.52 kg/m   Physical Exam   NAD HEENT:   PERRL, EOMI, TMs pearly gray, throat without injection Neck:  Supple, No adenopathy. Chest:  CTA.  Serpinginous line of erythema going from lower left flank to mid thoracic left back with a 1 cm circular area of more obvious erythema and swelling and palpable soft tissue mass beneath.   CV:  RRR with normal S1 and S2, No S3, S4 or murmur.  No carotid bruit.  Carotid, radial and DP pulses normal and equal Abd:  S, NT, No HSM or mass, + BS LE:  No edema.  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  CBC, CMP Addendum:  Sent in Rx for Ivermectin 3 mg tabs, to take 5.5 tabs in one dose  2.  DM/obesity:  A1C. Discussed continued goals for lifestyle changes.   Referral to [facility] for diabetic eye exam  3.  Hyperlipidemia:  FLP in next week.  With me in 3 months   4.  Leg cramps:  Discussed staying  hydrated.  Consider Ivory soap between sheets at foot of bed.  Stretches.  CMP

## 2019-08-06 LAB — CBC WITH DIFFERENTIAL/PLATELET
Basophils Absolute: 0 10*3/uL (ref 0.0–0.2)
Basos: 1 %
EOS (ABSOLUTE): 0.3 10*3/uL (ref 0.0–0.4)
Eos: 4 %
Hematocrit: 33.8 % — ABNORMAL LOW (ref 34.0–46.6)
Hemoglobin: 10.3 g/dL — ABNORMAL LOW (ref 11.1–15.9)
Immature Grans (Abs): 0 10*3/uL (ref 0.0–0.1)
Immature Granulocytes: 0 %
Lymphocytes Absolute: 2.9 10*3/uL (ref 0.7–3.1)
Lymphs: 34 %
MCH: 22.1 pg — ABNORMAL LOW (ref 26.6–33.0)
MCHC: 30.5 g/dL — ABNORMAL LOW (ref 31.5–35.7)
MCV: 72 fL — ABNORMAL LOW (ref 79–97)
Monocytes Absolute: 0.5 10*3/uL (ref 0.1–0.9)
Monocytes: 6 %
Neutrophils Absolute: 4.6 10*3/uL (ref 1.4–7.0)
Neutrophils: 55 %
Platelets: 344 10*3/uL (ref 150–450)
RBC: 4.67 x10E6/uL (ref 3.77–5.28)
RDW: 17.3 % — ABNORMAL HIGH (ref 11.7–15.4)
WBC: 8.3 10*3/uL (ref 3.4–10.8)

## 2019-08-06 LAB — COMPREHENSIVE METABOLIC PANEL
ALT: 13 IU/L (ref 0–32)
AST: 16 IU/L (ref 0–40)
Albumin/Globulin Ratio: 1.3 (ref 1.2–2.2)
Albumin: 3.8 g/dL (ref 3.8–4.8)
Alkaline Phosphatase: 69 IU/L (ref 39–117)
BUN/Creatinine Ratio: 16 (ref 9–23)
BUN: 14 mg/dL (ref 6–24)
Bilirubin Total: <0.2 mg/dL (ref 0.0–1.2)
CO2: 23 mmol/L (ref 20–29)
Calcium: 9.1 mg/dL (ref 8.7–10.2)
Chloride: 104 mmol/L (ref 96–106)
Creatinine, Ser: 0.89 mg/dL (ref 0.57–1.00)
GFR calc Af Amer: 90 mL/min/{1.73_m2} (ref >59)
GFR calc non Af Amer: 78 mL/min/{1.73_m2} (ref >59)
Globulin, Total: 3 g/dL (ref 1.5–4.5)
Glucose: 98 mg/dL (ref 65–99)
Potassium: 3.8 mmol/L (ref 3.5–5.2)
Sodium: 141 mmol/L (ref 134–144)
Total Protein: 6.8 g/dL (ref 6.0–8.5)

## 2019-08-06 LAB — HGB A1C W/O EAG: Hgb A1c MFr Bld: 6.9 % — ABNORMAL HIGH (ref 4.8–5.6)

## 2019-08-14 ENCOUNTER — Other Ambulatory Visit (INDEPENDENT_AMBULATORY_CARE_PROVIDER_SITE_OTHER): Payer: Self-pay

## 2019-08-14 ENCOUNTER — Other Ambulatory Visit: Payer: Self-pay

## 2019-08-14 DIAGNOSIS — E782 Mixed hyperlipidemia: Secondary | ICD-10-CM

## 2019-08-15 LAB — LIPID PANEL W/O CHOL/HDL RATIO
Cholesterol, Total: 191 mg/dL (ref 100–199)
HDL: 61 mg/dL (ref >39)
LDL Chol Calc (NIH): 115 mg/dL — ABNORMAL HIGH (ref 0–99)
Triglycerides: 84 mg/dL (ref 0–149)
VLDL Cholesterol Cal: 15 mg/dL (ref 5–40)

## 2019-08-18 ENCOUNTER — Telehealth: Payer: Self-pay | Admitting: Internal Medicine

## 2019-08-18 ENCOUNTER — Other Ambulatory Visit: Payer: Self-pay

## 2019-08-18 MED ORDER — METFORMIN HCL ER 500 MG PO TB24: ORAL_TABLET | ORAL | 11 refills | Status: DC

## 2019-08-18 NOTE — Telephone Encounter (Signed)
Rx  sent  to  [facility]

## 2019-08-18 NOTE — Telephone Encounter (Signed)
Patient called requesting Rx on metFORMIN (GLUCOPHAGE-XR) 500 MG 24 hr tablet to be called in at [facility]

## 2019-08-27 MED ORDER — FERROUS GLUCONATE 324 (38 FE) MG PO TABS: ORAL_TABLET | ORAL | 3 refills | Status: DC

## 2019-08-27 MED ORDER — IVERMECTIN 3 MG PO TABS: 200.0000 ug/kg | ORAL_TABLET | Freq: Once | ORAL | 0 refills | Status: AC

## 2019-08-27 MED ORDER — ATORVASTATIN CALCIUM 20 MG PO TABS: 20.0000 mg | ORAL_TABLET | Freq: Every day | ORAL | 11 refills | Status: DC

## 2019-10-27 ENCOUNTER — Other Ambulatory Visit: Payer: Self-pay

## 2019-10-27 DIAGNOSIS — Z79899 Other long term (current) drug therapy: Secondary | ICD-10-CM

## 2019-10-27 DIAGNOSIS — E782 Mixed hyperlipidemia: Secondary | ICD-10-CM

## 2019-10-28 LAB — COMPREHENSIVE METABOLIC PANEL
ALT: 17 IU/L (ref 0–32)
AST: 18 IU/L (ref 0–40)
Albumin/Globulin Ratio: 1.4 (ref 1.2–2.2)
Albumin: 4 g/dL (ref 3.8–4.8)
Alkaline Phosphatase: 68 IU/L (ref 48–121)
BUN/Creatinine Ratio: 24 — ABNORMAL HIGH (ref 9–23)
BUN: 15 mg/dL (ref 6–24)
Bilirubin Total: 0.4 mg/dL (ref 0.0–1.2)
CO2: 21 mmol/L (ref 20–29)
Calcium: 9.1 mg/dL (ref 8.7–10.2)
Chloride: 105 mmol/L (ref 96–106)
Creatinine, Ser: 0.63 mg/dL (ref 0.57–1.00)
GFR calc Af Amer: 124 mL/min/{1.73_m2} (ref >59)
GFR calc non Af Amer: 108 mL/min/{1.73_m2} (ref >59)
Globulin, Total: 2.8 g/dL (ref 1.5–4.5)
Glucose: 132 mg/dL — ABNORMAL HIGH (ref 65–99)
Potassium: 4.6 mmol/L (ref 3.5–5.2)
Sodium: 138 mmol/L (ref 134–144)
Total Protein: 6.8 g/dL (ref 6.0–8.5)

## 2019-10-28 LAB — CBC WITH DIFFERENTIAL/PLATELET
Basophils Absolute: 0 10*3/uL (ref 0.0–0.2)
Basos: 1 %
EOS (ABSOLUTE): 0.1 10*3/uL (ref 0.0–0.4)
Eos: 1 %
Hematocrit: 33.1 % — ABNORMAL LOW (ref 34.0–46.6)
Hemoglobin: 9.6 g/dL — ABNORMAL LOW (ref 11.1–15.9)
Immature Grans (Abs): 0 10*3/uL (ref 0.0–0.1)
Immature Granulocytes: 0 %
Lymphocytes Absolute: 2.1 10*3/uL (ref 0.7–3.1)
Lymphs: 32 %
MCH: 21.3 pg — ABNORMAL LOW (ref 26.6–33.0)
MCHC: 29 g/dL — ABNORMAL LOW (ref 31.5–35.7)
MCV: 73 fL — ABNORMAL LOW (ref 79–97)
Monocytes Absolute: 0.4 10*3/uL (ref 0.1–0.9)
Monocytes: 7 %
Neutrophils Absolute: 3.9 10*3/uL (ref 1.4–7.0)
Neutrophils: 59 %
Platelets: 333 10*3/uL (ref 150–450)
RBC: 4.51 x10E6/uL (ref 3.77–5.28)
RDW: 17.8 % — ABNORMAL HIGH (ref 11.7–15.4)
WBC: 6.5 10*3/uL (ref 3.4–10.8)

## 2019-10-28 LAB — LIPID PANEL W/O CHOL/HDL RATIO
Cholesterol, Total: 212 mg/dL — ABNORMAL HIGH (ref 100–199)
HDL: 48 mg/dL (ref >39)
LDL Chol Calc (NIH): 133 mg/dL — ABNORMAL HIGH (ref 0–99)
Triglycerides: 171 mg/dL — ABNORMAL HIGH (ref 0–149)
VLDL Cholesterol Cal: 31 mg/dL (ref 5–40)

## 2019-11-04 ENCOUNTER — Encounter: Payer: Self-pay | Admitting: Internal Medicine

## 2019-11-04 ENCOUNTER — Telehealth: Payer: Self-pay | Admitting: Internal Medicine

## 2019-11-04 ENCOUNTER — Ambulatory Visit: Payer: Self-pay | Admitting: Internal Medicine

## 2019-11-04 VITALS — BP 118/78 | HR 66 | Resp 12 | Ht 60.0 in | Wt 188.0 lb

## 2019-11-04 DIAGNOSIS — D509 Iron deficiency anemia, unspecified: Secondary | ICD-10-CM | POA: Insufficient documentation

## 2019-11-04 DIAGNOSIS — M5416 Radiculopathy, lumbar region: Secondary | ICD-10-CM

## 2019-11-04 NOTE — Patient Instructions (Signed)
From  Dr. Maryfrances Bunnell: We have sent a referral for physical therapy I recommend you first try using ibuprofen: Take ibuprofen 600 mg (three tabs) at night before bed If you wake up, you may also take acetaminophen 1-2 tabs   If 600 mg (3 tabs) of ibuprofen doesn't quite help, try 800 mg (4 tabs) at night Don't go over 4 tabs   If things are improving after physical therapy at [facility], things are all good If things are still bothering you a lot and affecting your quality of life, come back to see Dr. Delrae Alfred for further testing           Citica Sciatica  La citica es el dolor, debilidad, hormigueo o prdida de la sensibilidad (adormecimiento) a lo largo del nervio citico. El nervio citico comienza en la parte inferior de la espalda y desciende por la parte posterior de cada pierna. Suele desaparecer por s sola o con tratamiento. A veces, la citica puede volver a aparecer (ser recurrente). Cules son las causas? Esta afeccin se produce cuando el nervio citico se comprime o se ejerce presin sobre l. Esto puede ser el resultado de:  Un disco que sobresale demasiado entre los huesos de la columna vertebral (hernia de disco).  Los cambios que se producen [employer] discos vertebrales al Secretary/administrator.  Una afeccin en un msculo de las nalgas.  Un crecimiento seo adicional cerca del nervio citico.  Una rotura (fractura) de la zona que est entre los huesos de la cadera (pelvis).  Embarazo.  Tumor. Esto es poco frecuente. Qu incrementa el riesgo? Es ms probable que tengan esta afeccin las personas que:  Software engineer deportes que ponen presin o tensin sobre la columna vertebral.  Tienen poca fuerza y facilidad de movimiento (flexibilidad).  Han tenido una lesin en la espalda en el pasado.  Han tenido una ciruga en la espalda.  Permanecen sentadas durante largos perodos.  Realizan actividades que implican agacharse o levantar objetos una y [place].  Tienen  mucho sobrepeso (es obeso). Cules son los signos o los sntomas? Los sntomas pueden variar de leves a muy graves. Pueden incluir los siguientes:  Cualquiera de los siguientes problemas en la parte inferior de la espalda, piernas, cadera o nalgas: ? Hormigueo leve, prdida de la sensibilidad o dolor sordo. ? Sensacin de ardor. ? Dolor agudo.  Prdida de la sensibilidad en la parte posterior de la pantorrilla o la planta del pie.  Debilidad en las piernas.  Dolor muy intenso en la espalda que dificulta el movimiento. Estos sntomas pueden empeorar al toser, Engineering geologist o rer. Tambin pueden empeorar al sentarse o estar de pie durante largos perodos. Cmo se trata? A menudo, esta afeccin mejora sin tratamiento. Sin embargo, el tratamiento puede incluir:  Multimedia programmer de [place] fsica o reducirla cuando siente dolor.  Hacer ejercicios y estiramientos.  Aplicar hielo o calor sobre la zona afectada.  Medicamentos para lo siguiente: ? Aliviar el dolor y la inflamacin. ? Relajar los msculos.  Inyecciones de medicamentos que ayudan a Engineer, materials, la irritacin y la hinchazn.  Ciruga. Siga estas instrucciones en su casa: Medicamentos  [employer] de venta libre y los recetados solamente como se lo haya indicado el mdico.  Consulte a su mdico si el medicamento que le recetaron: ? Hace que sea necesario que evite conducir o usar maquinaria pesada. ? Puede causarle dificultad para defecar (estreimiento). Es posible que deba tomar estas medidas para prevenir o tratar los problemas para defecar:  Beber suficiente lquido para Radio producer pis (la orina) de color amarillo plido.  Tomar medicamentos recetados o de [employer].  Comer alimentos ricos en fibra. Entre ellos, frijoles, cereales integrales y frutas y verduras frescas.  Limitar los alimentos con alto contenido de grasa y International aid/development worker. Estos incluyen alimentos fritos o dulces. Control del dolor       Si se lo indican, aplique hielo en la zona afectada. ? Ponga el hielo en una bolsa plstica. ? Coloque una [employer] piel y Copy. ? Coloque el hielo durante , 2 a 3veces por da.  Si se lo indican, aplique calor en la zona afectada. Use la fuente de calor que el mdico le indique, por ejemplo, una compresa de calor hmedo o una almohadilla trmica. ? Coloque una [employer] piel y la fuente de Airline pilot. ? Aplique calor durante 20 a . ? Retire la fuente de calor si la piel se pone de color rojo brillante. Esto es muy importante si no puede Financial risk analyst, calor o fro. Puede correr un riesgo mayor de sufrir quemaduras. Actividad   Retome sus actividades habituales como se lo haya indicado el mdico. Pregntele al mdico qu actividades son seguras para usted.  Evite las [employer] sntomas.  Descanse por breves perodos Administrator. ? Cuando descanse durante perodos ms largos, haga alguna actividad fsica o un estiramiento entre los perodos de descanso. ? Evite estar sentado durante largos perodos sin moverse. Levntese y [place] al menos una vez cada hora.  Haga ejercicios y estrese con regularidad, como se lo indic el mdico.  No levante nada que pese ms de 10libras (4.5kg) mientras tenga sntomas de citica. ? Aunque no tenga sntomas, evite levantar objetos pesados. ? Evite levantar objetos pesados de forma repetida.  Al levantar objetos, hgalo siempre de una forma que sea segura para su cuerpo. Para esto, debe hacer lo siguiente: ? Flexione las rodillas. ? Mantenga el objeto cerca del cuerpo. ? No gire el cuerpo. Instrucciones generales  Mantenga un peso saludable.  Use calzado cmodo, que le d soporte al pie. Evite usar tacones.  Evite dormir sobre un colchn que sea demasiado blando o demasiado duro. Es posible que sienta menos dolor si duerme en un colchn con apoyo suficientemente firme para la  espalda.  Concurra a todas las visitas de [address] se lo haya indicado el mdico. Esto es importante. Comunquese con un mdico si:  Tiene un dolor con estas caractersticas: ? Lo despierta cuando est dormido. ? Empeora al estar recostado. ? Es Government social research officer que tena en el pasado. ? Dura ms de 4semanas.  Pierde peso sin proponrselo. Solicite ayuda inmediatamente si:  No puede controlar la orina (miccin) ni la evacuacin de la materia fecal (defecacin).  Tiene debilidad en alguna de estas zonas, y la debilidad empeora: ? La parte inferior de la espalda. ? La zona que se encuentra entre los [employer] caderas. ? Las nalgas. ? Las piernas.  Siente irritacin o inflamacin en la espalda.  Tiene sensacin de ardor al [employer]. Resumen  La citica es el dolor, debilidad, hormigueo o prdida de la sensibilidad (adormecimiento) a lo largo del nervio citico.  Esta afeccin se produce cuando el nervio citico se comprime o se ejerce presin sobre l.  La citica puede Programmer, multimedia, hormigueo o prdida de la sensibilidad (adormecimiento) en la parte inferior de la espalda, las piernas, las caderas y las nalgas.  [place] a  menudo incluye reposo, ejercicio, medicamentos y Contractor hielo o calor en la zona afectada. Esta informacin no tiene Theme park manager el consejo del mdico. Asegrese de hacerle al mdico cualquier pregunta que tenga. Document Revised: 06/04/2018 Document Reviewed: 06/04/2018 Elsevier Patient Education  2020 [employer].

## 2019-11-04 NOTE — Progress Notes (Signed)
Acute Office Visit  Subjective:    Patient ID: Kelsey Keith, female    DOB: 30-Oct-1975, 44 y.o.   MRN: [id number]  Chief Complaint  Patient presents with  . Knee Pain    patient with right knee pain states she feels like warm water going down her knee. states when she sittting is when she feel the pain. patient also fell x 2 months ago now having right leg pain down into ankle since fall.    HPI The patient describes insidious onset of right leg pain over the last weeks to month.    It is aching pain, from the right buttock, radiating into the right foot.  It is worse at night, resolves with activity.  Weight bearing doesn't make it worse.  No fever, weight loss, loss of sensation or weakness.  She has tried acetaminophen, which has not helped much.  With regard to the ankle, this is some mild residual right ankle pain, that she feels with activity, from time to time.  It is residual from her bad ankle sprain several months ago.       Past Medical History:  Diagnosis Date  . Depression age 67 yo and 2002   First episode age 17 yo, then another after child born 2002.  Was treated for 5 years then with Zoloft.  Has not needed treatment since 2007.  Marland Kitchen DM type 2 (diabetes mellitus, type 2) ([place]) 07/26/2016  . Hyperlipidemia 2009  . Obesity 07/09/2016  . 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 [place], 2002,2012  . TUBAL LIGATION  2012    Family History  Problem Relation Age of Onset  . Heart disease Mother   . Stroke Mother   . Hypertension Mother   . Heart disease Father   . Stroke Father   . Diabetes Brother   . Irritable bowel syndrome Daughter   . Breast cancer Neg Hx       Outpatient Medications Prior to Visit  Medication Sig Dispense Refill  . Blood Glucose Monitoring Suppl (AGAMATRIX PRESTO) w/Device KIT Check sugars twice daily before meals 1 kit 0  . glucose blood  (AGAMATRIX PRESTO TEST) test strip Check sugars twice daily before meals 100 each 12  . metFORMIN (GLUCOPHAGE-XR) 500 MG 24 hr tablet Take 1 tablet by mouth once daily with breakfast 60 tablet 11  . atorvastatin (LIPITOR) 20 MG tablet Take 1 tablet (20 mg total) by mouth daily. (Patient not taking: Reported on 11/04/2019) 30 tablet 11  . cetirizine (ZYRTEC) 10 MG tablet Take 1 tablet (10 mg total) by mouth daily. (Patient not taking: Reported on 08/05/2019) 30 tablet 11  . ferrous gluconate (FERGON) 324 MG tablet 1 tab by mouth daily with citrus fruit (Patient not taking: Reported on 11/04/2019)  3  . nitroGLYCERIN (NITROSTAT) 0.4 MG SL tablet Place 1 tablet (0.4 mg total) under the tongue every 5 (five) minutes as needed for chest pain. Max of 3 dosing for 1 episode (Patient not taking: Reported on 06/04/2018) 25 tablet 0   No facility-administered medications prior to visit.    No Known Allergies  Review of Systems     Objective:    Physical Exam  General: Well-nourished adult female, sitting in a chair, no acute distress, interactive Skin: Skin overlying the right thigh, knee, calf, and ankle is normal without redness, swelling. Musculoskeletal: She has normal range of motion of the bilateral  hips, knees, and ankles.  There is no deformity or effusion of the ankle, knee.  There is no tenderness or pain with range of motion or palpation of the right knee.  There is mild tenderness to palpation of the right lateral ankle, just superior to the malleolus.  Her left leg straight leg raise is normal, but on the right, her leg pain symptoms are reproduced with straight leg raise.  BP 118/78 (BP Location: Left Arm, Patient Position: Sitting, Cuff Size: Normal)   Pulse 66   Resp 12   Ht 5' (1.524 m)   Wt 188 lb (85.3 kg)   LMP 10/18/2019   BMI 36.72 kg/m  Wt Readings from Last 3 Encounters:  11/04/19 188 lb (85.3 kg)  08/05/19 187 lb (84.8 kg)  06/17/18 184 lb (83.5 kg)        Assessment &  Plan:   Problem List Items Addressed This Visit      Other   Microcytic anemia    Other Visit Diagnoses    Lumbar radiculopathy    -  Primary   Relevant Orders   Ambulatory referral to Physical Therapy       Lumbar radiculopathy Educated on pathology and natural history of sciatica, its causes.  Her course is subacute, seems worse at night, relatively unaffected in the day. -NSAIDs recommended -Heat pad recommended -PT referral made  -If no improvement after NSAIDs and PT and time, have recommended to return here for follow up    Microcytic anemia -Guiac cards given          Edwin Dada, MD

## 2019-11-04 NOTE — Telephone Encounter (Signed)
She needs to pick up stool cards again and repeat testing for blood in stool. Also--is she still having period and are they heavy? To start Ferrous gluconate 324 mg once daily.  Spoke with patient today who stated forgot to do stool cards test but stated will pick another one today and will get Ferrous gluconate 324 mg . She also stated she is stills having periods and always have been heavy.

## 2019-12-09 ENCOUNTER — Ambulatory Visit: Payer: Self-pay | Admitting: Internal Medicine

## 2020-02-17 ENCOUNTER — Encounter: Payer: Self-pay | Admitting: Internal Medicine

## 2020-02-17 ENCOUNTER — Ambulatory Visit: Payer: Self-pay | Admitting: Internal Medicine

## 2020-02-17 VITALS — BP 120/67 | HR 80 | Resp 12 | Ht 60.25 in | Wt 188.0 lb

## 2020-02-17 DIAGNOSIS — B769 Hookworm disease, unspecified: Secondary | ICD-10-CM

## 2020-02-17 DIAGNOSIS — N940 Mittelschmerz: Secondary | ICD-10-CM

## 2020-02-17 DIAGNOSIS — Z23 Encounter for immunization: Secondary | ICD-10-CM

## 2020-02-17 DIAGNOSIS — M7061 Trochanteric bursitis, right hip: Secondary | ICD-10-CM | POA: Insufficient documentation

## 2020-02-17 DIAGNOSIS — M5431 Sciatica, right side: Secondary | ICD-10-CM

## 2020-02-17 DIAGNOSIS — E782 Mixed hyperlipidemia: Secondary | ICD-10-CM

## 2020-02-17 DIAGNOSIS — E118 Type 2 diabetes mellitus with unspecified complications: Secondary | ICD-10-CM

## 2020-02-17 MED ORDER — IVERMECTIN 3 MG PO TABS: ORAL_TABLET | ORAL | 0 refills | Status: DC

## 2020-02-17 NOTE — Progress Notes (Signed)
Subjective:    Patient ID: Kelsey Keith, female   DOB: Aug 29, 1975, 44 y.o.   MRN: [id number]   HPI   1.  DM:  Checking sugars.  Checking sugars only in morning generally and running 130 range.  Never sees her blood sugar more than 150.    Perhaps some polyuria, but no polydipsia.  Drinks a lot of coffee before bedtime.   Did have diabetic eye check at [facility].  No diabetic changes.  She did get glasses.   She has not had her flu vaccine yet.    2.  Hyperlipidemia:  Is taking Atorvastatin daily.  Her cholesterol panel in July was not good.  She is nonfasting today.  3.  Cutaneous Larva migrans:  Took Ivermectin 73m tabs, 5.5 tabs in one dose for this earlier in year.  Felt better for few days and then felt the movement again under her skin with burning.  They have not noted the redness.  They eat ceviche they prepare themselves frequently.  They do eat ceviche every other week or so.    4.  Right leg pain/sciatica:  [facility]  Points to lateral right buttock with radiation down lateral leg.  Sometimes, right foot gets numb.  Notes with driving or sitting for long time in particular, but mostly a problem at rest.  5.  Suprapubic pain, similar to uterine contraction pain.  Occurs every other month.  Lasts one night.  Generally about 2 weeks after her period --right in between her periods.  Has had for years, then goes away, then returns.    Current Meds  Medication Sig   atorvastatin (LIPITOR) 20 MG tablet Take 1 tablet (20 mg total) by mouth daily.   Blood Glucose Monitoring Suppl (AGAMATRIX PRESTO) w/Device KIT Check sugars twice daily before meals   cetirizine (ZYRTEC) 10 MG tablet Take 1 tablet (10 mg total) by mouth daily.   ferrous gluconate (FERGON) 324 MG tablet 1 tab by mouth daily with citrus fruit   glucose blood (AGAMATRIX PRESTO TEST) test strip Check sugars twice daily before meals   metFORMIN  (GLUCOPHAGE-XR) 500 MG 24 hr tablet Take 1 tablet by mouth once daily with breakfast   No Known Allergies   Review of Systems    Objective:   BP 120/67 (BP Location: Right Arm, Patient Position: Sitting, Cuff Size: Normal)   Pulse 80   Resp 12   Ht 5' 0.25" (1.53 m)   Wt 188 lb (85.3 kg)   LMP 02/09/2020 (Exact Date)   BMI 36.41 kg/m   Physical Exam NAD Lungs:  CTA CV: RRR without murmur or rub.  Radial and DP pulses normal and equal. Abd:  S, NT, No HSM or mass. + BS.  No suprapubic or pelvic anterior pelvic tenderness. MS:  Tender over left greater trochanter, but does not reproduce same discomfort.   Neuro: LE:  Motor 5/5 and DTRs 2+/4 throughout Skin:  no erythema or skin findings.  Assessment & Plan   DM/hyperlipidemia:  to return in 2 weeks for fasting labs including A1C and FLP  2.  Cutaneous larva migrans:  one more course of Ivermectin 5.5 tabs of 3 mg.  3.  Intermittent pelvic pain that seems to occur when would expect ovulation--Possibly [place]--needs annual exam with pelvic set up.  4.  HM:  Hepatitis screen with COVID booster vaccine next Monday.  Flu vaccine today.  5.  Right  sciatica and greater trochanteric bursitis:  phone number for PT clinic given--will contact them about referral again.  Went over exercises to perform in meantime.

## 2020-02-17 NOTE — Patient Instructions (Signed)
[facility]:  [phone]  

## 2020-03-04 ENCOUNTER — Other Ambulatory Visit: Payer: Self-pay

## 2020-03-04 DIAGNOSIS — Z1159 Encounter for screening for other viral diseases: Secondary | ICD-10-CM

## 2020-03-04 DIAGNOSIS — E118 Type 2 diabetes mellitus with unspecified complications: Secondary | ICD-10-CM

## 2020-03-04 DIAGNOSIS — E782 Mixed hyperlipidemia: Secondary | ICD-10-CM

## 2020-03-05 LAB — CBC WITH DIFFERENTIAL/PLATELET
Basophils Absolute: 0 10*3/uL (ref 0.0–0.2)
Basos: 1 %
EOS (ABSOLUTE): 0.1 10*3/uL (ref 0.0–0.4)
Eos: 2 %
Hematocrit: 31.9 % — ABNORMAL LOW (ref 34.0–46.6)
Hemoglobin: 9.8 g/dL — ABNORMAL LOW (ref 11.1–15.9)
Immature Grans (Abs): 0 10*3/uL (ref 0.0–0.1)
Immature Granulocytes: 0 %
Lymphocytes Absolute: 2 10*3/uL (ref 0.7–3.1)
Lymphs: 37 %
MCH: 21.7 pg — ABNORMAL LOW (ref 26.6–33.0)
MCHC: 30.7 g/dL — ABNORMAL LOW (ref 31.5–35.7)
MCV: 71 fL — ABNORMAL LOW (ref 79–97)
Monocytes Absolute: 0.3 10*3/uL (ref 0.1–0.9)
Monocytes: 6 %
Neutrophils Absolute: 3 10*3/uL (ref 1.4–7.0)
Neutrophils: 54 %
Platelets: 321 10*3/uL (ref 150–450)
RBC: 4.51 x10E6/uL (ref 3.77–5.28)
RDW: 16.9 % — ABNORMAL HIGH (ref 11.7–15.4)
WBC: 5.4 10*3/uL (ref 3.4–10.8)

## 2020-03-05 LAB — LIPID PANEL W/O CHOL/HDL RATIO
Cholesterol, Total: 205 mg/dL — ABNORMAL HIGH (ref 100–199)
HDL: 54 mg/dL (ref >39)
LDL Chol Calc (NIH): 139 mg/dL — ABNORMAL HIGH (ref 0–99)
Triglycerides: 69 mg/dL (ref 0–149)
VLDL Cholesterol Cal: 12 mg/dL (ref 5–40)

## 2020-03-05 LAB — HEPATITIS C ANTIBODY: Hep C Virus Ab: <0.1 s/co ratio (ref 0.0–0.9)

## 2020-03-05 LAB — HEMOGLOBIN A1C
Est. average glucose Bld gHb Est-mCnc: 146 mg/dL
Hgb A1c MFr Bld: 6.7 % — ABNORMAL HIGH (ref 4.8–5.6)

## 2020-04-06 MED ORDER — ATORVASTATIN CALCIUM 40 MG PO TABS
ORAL_TABLET | ORAL | 11 refills | Status: DC
Start: 2020-04-06 — End: 2020-06-09

## 2020-05-23 ENCOUNTER — Other Ambulatory Visit: Payer: Self-pay | Admitting: Internal Medicine

## 2020-05-23 ENCOUNTER — Other Ambulatory Visit: Payer: Self-pay

## 2020-05-23 DIAGNOSIS — Z79899 Other long term (current) drug therapy: Secondary | ICD-10-CM

## 2020-05-23 DIAGNOSIS — E782 Mixed hyperlipidemia: Secondary | ICD-10-CM

## 2020-05-23 NOTE — Progress Notes (Signed)
Here for FLP, hepatic profile Taking Atorvastatin--no problems and takes regularly

## 2020-05-24 LAB — LIPID PANEL W/O CHOL/HDL RATIO
Cholesterol, Total: 215 mg/dL — ABNORMAL HIGH (ref 100–199)
HDL: 53 mg/dL (ref >39)
LDL Chol Calc (NIH): 143 mg/dL — ABNORMAL HIGH (ref 0–99)
Triglycerides: 105 mg/dL (ref 0–149)
VLDL Cholesterol Cal: 19 mg/dL (ref 5–40)

## 2020-05-24 LAB — HEPATIC FUNCTION PANEL
ALT: 17 IU/L (ref 0–32)
AST: 16 IU/L (ref 0–40)
Albumin: 4 g/dL (ref 3.8–4.8)
Alkaline Phosphatase: 69 IU/L (ref 44–121)
Bilirubin Total: 0.3 mg/dL (ref 0.0–1.2)
Bilirubin, Direct: <0.1 mg/dL (ref 0.00–0.40)
Total Protein: 7.1 g/dL (ref 6.0–8.5)

## 2020-05-25 ENCOUNTER — Encounter: Payer: Self-pay | Admitting: Internal Medicine

## 2020-06-09 MED ORDER — ATORVASTATIN CALCIUM 80 MG PO TABS: 80.0000 mg | ORAL_TABLET | Freq: Every day | ORAL | 11 refills | Status: DC

## 2020-07-25 ENCOUNTER — Other Ambulatory Visit: Payer: Self-pay

## 2020-07-25 ENCOUNTER — Other Ambulatory Visit: Payer: Self-pay | Admitting: Internal Medicine

## 2020-07-25 DIAGNOSIS — Z79899 Other long term (current) drug therapy: Secondary | ICD-10-CM

## 2020-07-25 DIAGNOSIS — E782 Mixed hyperlipidemia: Secondary | ICD-10-CM

## 2020-07-26 LAB — HEPATIC FUNCTION PANEL
ALT: 16 IU/L (ref 0–32)
AST: 13 IU/L (ref 0–40)
Albumin: 4 g/dL (ref 3.8–4.8)
Alkaline Phosphatase: 65 IU/L (ref 44–121)
Bilirubin Total: 0.3 mg/dL (ref 0.0–1.2)
Bilirubin, Direct: <0.1 mg/dL (ref 0.00–0.40)
Total Protein: 6.9 g/dL (ref 6.0–8.5)

## 2020-07-26 LAB — LIPID PANEL W/O CHOL/HDL RATIO
Cholesterol, Total: 197 mg/dL (ref 100–199)
HDL: 55 mg/dL (ref >39)
LDL Chol Calc (NIH): 125 mg/dL — ABNORMAL HIGH (ref 0–99)
Triglycerides: 96 mg/dL (ref 0–149)
VLDL Cholesterol Cal: 17 mg/dL (ref 5–40)

## 2020-10-21 ENCOUNTER — Encounter: Payer: Self-pay | Admitting: Internal Medicine

## 2020-10-31 ENCOUNTER — Other Ambulatory Visit: Payer: Self-pay | Admitting: Internal Medicine

## 2021-03-06 ENCOUNTER — Telehealth: Payer: Self-pay

## 2021-03-06 NOTE — Telephone Encounter (Signed)
Pt came to report left foot pain on lateral side that has lasted about a month. Has made it difficult to walk on. Pain has been consistent. Does not remember being caused by injury. Uses icy hot and vapor rub, but only relieves pain short term. Pt is diabetic.

## 2021-03-20 NOTE — Telephone Encounter (Signed)
Acute or wait list

## 2021-03-21 NOTE — Telephone Encounter (Signed)
For any openings

## 2021-03-28 ENCOUNTER — Encounter: Payer: Self-pay | Admitting: Internal Medicine

## 2021-03-28 ENCOUNTER — Other Ambulatory Visit: Payer: Self-pay

## 2021-03-28 ENCOUNTER — Ambulatory Visit: Payer: Self-pay | Admitting: Internal Medicine

## 2021-03-28 VITALS — BP 132/70 | HR 76 | Resp 20 | Ht 60.0 in | Wt 190.0 lb

## 2021-03-28 DIAGNOSIS — M79605 Pain in left leg: Secondary | ICD-10-CM

## 2021-03-28 DIAGNOSIS — M25561 Pain in right knee: Secondary | ICD-10-CM

## 2021-03-28 DIAGNOSIS — M79604 Pain in right leg: Secondary | ICD-10-CM

## 2021-03-28 DIAGNOSIS — M79672 Pain in left foot: Secondary | ICD-10-CM

## 2021-03-28 DIAGNOSIS — I83813 Varicose veins of bilateral lower extremities with pain: Secondary | ICD-10-CM

## 2021-03-28 MED ORDER — AGAMATRIX ULTRA-THIN LANCETS MISC: 11 refills | Status: DC

## 2021-03-28 MED ORDER — METFORMIN HCL ER 500 MG PO TB24: 500.0000 mg | ORAL_TABLET | Freq: Every day | ORAL | 3 refills | Status: DC

## 2021-03-28 MED ORDER — AGAMATRIX PRESTO TEST VI STRP: ORAL_STRIP | 12 refills | Status: DC

## 2021-03-28 MED ORDER — CETIRIZINE HCL 10 MG PO TABS: 10.0000 mg | ORAL_TABLET | Freq: Every day | ORAL | 11 refills | Status: DC

## 2021-03-28 MED ORDER — ATORVASTATIN CALCIUM 80 MG PO TABS: 80.0000 mg | ORAL_TABLET | Freq: Every day | ORAL | 11 refills | Status: DC

## 2021-03-28 NOTE — Telephone Encounter (Signed)
Seen by Dr mulberry °

## 2021-03-28 NOTE — Patient Instructions (Signed)
No kneeling without a thick cushion. No deep knee bending.

## 2021-03-28 NOTE — Progress Notes (Signed)
    Subjective:    Patient ID: Kelsey Keith, female   DOB: [birth year], 45 y.o.   MRN: [id number]   HPI  Kelsey Keith interprets  Pain in left lateral foot beginning on October 1.  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  Was wearing tennis shoes.  Was a 9 hour work day.  Was very tired.  No acute injury from what she remembered.  States has gradually worsened.  Tried wearing a different pair of shoes, though the original ones were very comfortable, but no change.   Both anterior thighs radiating down to toes hurt.  Feels like it is a squeezing pain--hurts all the time.  Wears jeans most of time working Also about the same time as the leg pain, her right knee started hurting.  Describes the pain as if she was hit there and the pain after being hit--sort of like a bruise.  No redness or swelling.  Hurts all the time.  Current Meds  Medication Sig   AgaMatrix Ultra-Thin Lancets MISC Check sugars twice daily before meals   Blood Glucose Monitoring Suppl (AGAMATRIX PRESTO) w/Device KIT Check sugars twice daily before meals   ferrous gluconate (FERGON) 324 MG tablet 1 tab by mouth daily with citrus fruit (Patient taking differently: 1 tab by mouth daily with citrus fruit. Misses days)   [DISCONTINUED] atorvastatin (LIPITOR) 80 MG tablet Take 1 tablet (80 mg total) by mouth daily.   [DISCONTINUED] cetirizine (ZYRTEC) 10 MG tablet Take 1 tablet (10 mg total) by mouth daily.   [DISCONTINUED] glucose blood (AGAMATRIX PRESTO TEST) test strip Check sugars twice daily before meals   [DISCONTINUED] metFORMIN (GLUCOPHAGE-XR) 500 MG 24 hr tablet Take 1 tablet by mouth once daily with breakfast   No Known Allergies   Review of Systems    Objective:   BP 132/70 (BP Location: Right Arm, Patient Position: Sitting, Cuff Size: Normal)   Pulse 76   Resp 20   Ht 5' (1.524 m)   Wt 190 lb (86.2 kg)   LMP 03/05/2021 (Exact Date)   BMI 37.11 kg/m    Physical Exam NAD LE:  Bilateral lower extrems with varicosities Right knee:  Full ROM, no palpable effusion.  NT with compression of patella against anterior joint.  No specific joint line tenderness.  No cruciate or collateral ligament laxity of tenderness. Left foot:  no deformity or swelling.  Tender at tarsal/metatarsal joint, 5th ray.    Assessment & Plan   Left foot pain:  referral to podiatry.  Ibuprofen and avoid shoes she wore at time of pain initiation.    2.  Varicose veins with pain:  No long term standing in place--to move if on feet.  Thigh high compression stockings to be placed first thing in morning and off at bedtime.  When sitting, elevated legs and recline.  3.  Right knee pain:  Not clear what cause.  She is to let me know if she would like to go ahead with Xray of left foot and right knee with [facility] or upfront payment with [facility] imaging.  Wynetta Fines checking with cost and will get back.    4.  DM/hyperlipidemia:  refilled meds and diabetic monitoring supplies.

## 2021-04-03 ENCOUNTER — Other Ambulatory Visit: Payer: Self-pay

## 2021-04-03 DIAGNOSIS — E118 Type 2 diabetes mellitus with unspecified complications: Secondary | ICD-10-CM

## 2021-04-03 DIAGNOSIS — E782 Mixed hyperlipidemia: Secondary | ICD-10-CM

## 2021-04-03 DIAGNOSIS — Z6836 Body mass index (BMI) 36.0-36.9, adult: Secondary | ICD-10-CM

## 2021-04-03 DIAGNOSIS — Z79899 Other long term (current) drug therapy: Secondary | ICD-10-CM

## 2021-04-03 DIAGNOSIS — E6609 Other obesity due to excess calories: Secondary | ICD-10-CM

## 2021-04-04 LAB — COMPREHENSIVE METABOLIC PANEL
ALT: 10 IU/L (ref 0–32)
AST: 13 IU/L (ref 0–40)
Albumin/Globulin Ratio: 1.4 (ref 1.2–2.2)
Albumin: 3.7 g/dL — ABNORMAL LOW (ref 3.8–4.8)
Alkaline Phosphatase: 66 IU/L (ref 44–121)
BUN/Creatinine Ratio: 17 (ref 9–23)
BUN: 12 mg/dL (ref 6–24)
Bilirubin Total: 0.3 mg/dL (ref 0.0–1.2)
CO2: 21 mmol/L (ref 20–29)
Calcium: 8.5 mg/dL — ABNORMAL LOW (ref 8.7–10.2)
Chloride: 104 mmol/L (ref 96–106)
Creatinine, Ser: 0.72 mg/dL (ref 0.57–1.00)
Globulin, Total: 2.7 g/dL (ref 1.5–4.5)
Glucose: 127 mg/dL — ABNORMAL HIGH (ref 70–99)
Potassium: 4.1 mmol/L (ref 3.5–5.2)
Sodium: 138 mmol/L (ref 134–144)
Total Protein: 6.4 g/dL (ref 6.0–8.5)
eGFR: 104 mL/min/{1.73_m2} (ref >59)

## 2021-04-04 LAB — CBC WITH DIFFERENTIAL/PLATELET
Basophils Absolute: 0 10*3/uL (ref 0.0–0.2)
Basos: 1 %
EOS (ABSOLUTE): 0.1 10*3/uL (ref 0.0–0.4)
Eos: 1 %
Hematocrit: 30.7 % — ABNORMAL LOW (ref 34.0–46.6)
Hemoglobin: 9.1 g/dL — ABNORMAL LOW (ref 11.1–15.9)
Immature Grans (Abs): 0 10*3/uL (ref 0.0–0.1)
Immature Granulocytes: 0 %
Lymphocytes Absolute: 1.8 10*3/uL (ref 0.7–3.1)
Lymphs: 29 %
MCH: 21.1 pg — ABNORMAL LOW (ref 26.6–33.0)
MCHC: 29.6 g/dL — ABNORMAL LOW (ref 31.5–35.7)
MCV: 71 fL — ABNORMAL LOW (ref 79–97)
Monocytes Absolute: 0.4 10*3/uL (ref 0.1–0.9)
Monocytes: 6 %
Neutrophils Absolute: 4 10*3/uL (ref 1.4–7.0)
Neutrophils: 63 %
Platelets: 328 10*3/uL (ref 150–450)
RBC: 4.31 x10E6/uL (ref 3.77–5.28)
RDW: 17 % — ABNORMAL HIGH (ref 11.7–15.4)
WBC: 6.2 10*3/uL (ref 3.4–10.8)

## 2021-04-04 LAB — LIPID PANEL W/O CHOL/HDL RATIO
Cholesterol, Total: 186 mg/dL (ref 100–199)
HDL: 48 mg/dL (ref >39)
LDL Chol Calc (NIH): 122 mg/dL — ABNORMAL HIGH (ref 0–99)
Triglycerides: 86 mg/dL (ref 0–149)
VLDL Cholesterol Cal: 16 mg/dL (ref 5–40)

## 2021-04-04 LAB — MICROALBUMIN / CREATININE URINE RATIO

## 2021-04-04 LAB — HEMOGLOBIN A1C
Est. average glucose Bld gHb Est-mCnc: 160 mg/dL
Hgb A1c MFr Bld: 7.2 % — ABNORMAL HIGH (ref 4.8–5.6)

## 2021-04-04 LAB — TSH: TSH: 1.02 u[IU]/mL (ref 0.450–4.500)

## 2021-04-10 ENCOUNTER — Other Ambulatory Visit: Payer: Self-pay

## 2021-04-10 ENCOUNTER — Encounter: Payer: Self-pay | Admitting: Internal Medicine

## 2021-04-10 ENCOUNTER — Ambulatory Visit: Payer: Self-pay | Admitting: Internal Medicine

## 2021-04-10 ENCOUNTER — Other Ambulatory Visit: Payer: Self-pay | Admitting: Internal Medicine

## 2021-04-10 VITALS — BP 128/78 | HR 80 | Resp 20 | Ht 60.5 in | Wt 190.0 lb

## 2021-04-10 DIAGNOSIS — Z23 Encounter for immunization: Secondary | ICD-10-CM

## 2021-04-10 DIAGNOSIS — Z124 Encounter for screening for malignant neoplasm of cervix: Secondary | ICD-10-CM

## 2021-04-10 DIAGNOSIS — E118 Type 2 diabetes mellitus with unspecified complications: Secondary | ICD-10-CM

## 2021-04-10 DIAGNOSIS — D509 Iron deficiency anemia, unspecified: Secondary | ICD-10-CM

## 2021-04-10 DIAGNOSIS — E782 Mixed hyperlipidemia: Secondary | ICD-10-CM

## 2021-04-10 DIAGNOSIS — Z1231 Encounter for screening mammogram for malignant neoplasm of breast: Secondary | ICD-10-CM

## 2021-04-10 DIAGNOSIS — Z0000 Encounter for general adult medical examination without abnormal findings: Secondary | ICD-10-CM

## 2021-04-10 NOTE — Progress Notes (Signed)
Subjective:    Patient ID: Kelsey Keith, female   DOB: [birth year], 45 y.o.   MRN: [id number]   HPI  CPE with pap  1.  Pap:  Last pap was 05/2016 and normal save for fungal elements.  2.  Mammogram:  Last was 2018 and normal.  Always normal.  No family history of breast cancer.    3.  Osteoprevention: Does drink almond milk 3 times daily.  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  4.  Guaiac Cards/FIT:    5.  Colonoscopy:  Never.  No family history of colon cancer.     6.  Immunizations:  Has not had Hep A vaccination.   7.  Glucose/Cholesterol:  A1C up to 7.2% and LDL down to 122, but still not at goal with ?80 mg of Atorvastatin. Lipid Panel     Component Value Date/Time   CHOL 186 04/03/2021 1006   TRIG 86 04/03/2021 1006   TRIG 135 07/27/2016 1010   HDL 48 04/03/2021 1006   CHOLHDL 5.0 (H) 07/27/2016 1010   CHOLHDL 5.2 Ratio 08/13/2007 2049   VLDL 33 08/13/2007 2049   LDLCALC 122 (H) 04/03/2021 1006   LABVLDL 16 04/03/2021 1006   Other:  microcytic anemia:  Does have heavy period the first day of period and 4 days of flow in total.  Forgets taking iron, but states she does not forget other meds.    Current Meds  Medication Sig   AgaMatrix Ultra-Thin Lancets MISC Check sugars twice daily before meals   atorvastatin (LIPITOR) 80 MG tablet Take 1 tablet (80 mg total) by mouth daily.   Blood Glucose Monitoring Suppl (AGAMATRIX PRESTO) w/Device KIT Check sugars twice daily before meals   cetirizine (ZYRTEC) 10 MG tablet Take 1 tablet (10 mg total) by mouth daily.   ferrous gluconate (FERGON) 324 MG tablet 1 tab by mouth daily with citrus fruit (Patient taking differently: 1 tab by mouth daily with citrus fruit. Misses days)   glucose blood (AGAMATRIX PRESTO TEST) test strip Check sugars twice daily before meals   metFORMIN (GLUCOPHAGE-XR) 500 MG 24 hr tablet Take 1 tablet (500 mg total) by mouth daily with breakfast.   No Known  Allergies  Past Medical History:  Diagnosis Date   Depression age 20 yo and 67   First episode age 2 yo, then another after child born 2002.  Was treated for 5 years then with Zoloft.  Has not needed treatment since 2007.   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   Past Surgical History:  Procedure Laterality Date   CESAREAN SECTION  1999, 2002,2012   TUBAL LIGATION  2012   Family History  Problem Relation Age of Onset   Heart disease Mother    Stroke Mother    Hypertension Mother    Heart disease Father    Stroke Father    Diabetes Brother    Irritable bowel syndrome Daughter    Breast cancer Neg Hx    Social History   Socioeconomic History   Marital status: Married    Spouse name: Alayn   Number of children: 3   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 In 2000   Lives at home husband, Hazard, and 3 children.   Social Determinants of Health   Financial Resource Strain: Not on file  Food Insecurity: Not on file  Transportation Needs: Not on file  Physical Activity: Not on file  Stress: Not on file  Social Connections: Not on file  Intimate Partner Violence: Not on file      Review of Systems    Objective:   BP 128/78 (BP Location: Left Arm, Patient Position: Sitting, Cuff Size: Normal)    Pulse 80     Resp 20    Ht 5' 0.5" (1.537 m)    Wt 190 lb (86.2 kg)    LMP 04/03/2021 (Within Days)    BMI 36.50 kg/m   Physical Exam HENT:     Head: Normocephalic and atraumatic.     Right Ear: Tympanic membrane, ear canal and external ear normal.     Left Ear: Tympanic membrane, ear canal and external ear normal.     Nose: Nose normal.     Mouth/Throat:     Mouth: Mucous membranes are moist.     Pharynx: Oropharynx is clear.  Eyes:     Extraocular Movements: Extraocular movements intact.     Conjunctiva/sclera: Conjunctivae normal.     Pupils: Pupils are equal, round, and reactive to light.     Comments: Discs sharp bilaterally.  Neck:     Thyroid: No thyroid mass or thyromegaly.  Cardiovascular:     Rate and Rhythm: Normal rate and regular rhythm.     Heart sounds: S1 normal and S2 normal. No murmur heard.   No friction rub. No S3 or S4 sounds.     Comments: No carotid bruits.  Carotid, radial, femoral, DP and PT pulses normal and equal.    Pulmonary:     Effort: Pulmonary effort is normal.     Breath sounds: Normal breath sounds.  Chest:  Breasts:    Right: No mass, nipple discharge or skin change.     Left: No mass or nipple discharge.  Abdominal:     General: Bowel sounds are normal.     Palpations: Abdomen is soft. There is no hepatomegaly, splenomegaly or mass.     Hernia: No hernia is present.  Genitourinary:    Comments: Normal external female genitalia Cervix without lesion. No uterine or adnexal mass or tenderness. Musculoskeletal:        General: Normal range of motion.     Cervical back: Normal range of motion and neck supple.     Right lower leg: No edema.     Left lower leg: No edema.  Lymphadenopathy:     Head:     Right side of head: No submental or submandibular adenopathy.     Left side of head: No submental or submandibular adenopathy.     Cervical: No cervical adenopathy.     Upper Body:     Right upper body: No supraclavicular or axillary adenopathy.  Left upper body: No supraclavicular or axillary adenopathy.     Lower Body: No right inguinal adenopathy. No left inguinal adenopathy.  Skin:    General: Skin is warm.     Capillary Refill: Capillary refill takes less than 2 seconds.     Findings: No rash.  Neurological:     General: No focal deficit present.     Mental Status: She is alert and oriented to person, place, and time.     Cranial Nerves: Cranial nerves 2-12 are intact.     Sensory: Sensation is intact.     Motor: Motor function is intact.     Coordination: Coordination is intact.     Gait: Gait is intact.     Deep Tendon Reflexes: Reflexes are normal and symmetric.  Psychiatric:        Attention and Perception: Attention and perception normal.        Mood and Affect: Mood normal.        Speech: Speech normal.        Behavior: Behavior normal. Behavior is cooperative.     Assessment & Plan    CPE with pap Schedule mammogram Hep A and Hep B vaccination.  Hep B in 2 months, repeat combination in 6 months. [place] bivalent booster.  2.  DM:  Not as well controlled.  To work on lifestyle changes with repeat A1C in 3 months. Optometry referral for DM eye check  3.  Hyperlipidemia:  LDL not close to goal on high dose Atorvastatin.  Would like to work on diet and physical activity to get down.  Repeat FLP in 3 months.  4.  Microcytic anemia:  iron studies and FIT testing.  Discuss working on diagnostic colonoscopies with a GI practice.

## 2021-04-11 LAB — IRON AND TIBC
Iron Saturation: 3 % — CL (ref 15–55)
Iron: 15 ug/dL — ABNORMAL LOW (ref 27–159)
Total Iron Binding Capacity: 474 ug/dL — ABNORMAL HIGH (ref 250–450)
UIBC: 459 ug/dL — ABNORMAL HIGH (ref 131–425)

## 2021-04-11 LAB — MICROALBUMIN / CREATININE URINE RATIO
Creatinine, Urine: 34.7 mg/dL
Microalb/Creat Ratio: <9 mg/g creat (ref 0–29)
Microalbumin, Urine: <3 ug/mL

## 2021-04-12 ENCOUNTER — Encounter: Payer: Self-pay | Admitting: Internal Medicine

## 2021-04-14 LAB — CYTOLOGY - PAP

## 2021-05-30 ENCOUNTER — Ambulatory Visit: Payer: Self-pay | Admitting: Internal Medicine

## 2021-07-07 ENCOUNTER — Other Ambulatory Visit: Payer: Self-pay

## 2021-07-17 ENCOUNTER — Other Ambulatory Visit: Payer: Self-pay

## 2021-07-17 DIAGNOSIS — D509 Iron deficiency anemia, unspecified: Secondary | ICD-10-CM

## 2021-07-18 ENCOUNTER — Encounter: Payer: Self-pay | Admitting: Internal Medicine

## 2021-07-18 ENCOUNTER — Ambulatory Visit: Payer: Self-pay | Admitting: Internal Medicine

## 2021-07-18 VITALS — BP 106/68 | HR 84 | Resp 20 | Ht 60.0 in | Wt 194.0 lb

## 2021-07-18 DIAGNOSIS — B769 Hookworm disease, unspecified: Secondary | ICD-10-CM

## 2021-07-18 DIAGNOSIS — D509 Iron deficiency anemia, unspecified: Secondary | ICD-10-CM

## 2021-07-18 DIAGNOSIS — E118 Type 2 diabetes mellitus with unspecified complications: Secondary | ICD-10-CM

## 2021-07-18 LAB — CBC WITH DIFFERENTIAL/PLATELET
Basophils Absolute: 0 10*3/uL (ref 0.0–0.2)
Basos: 1 %
EOS (ABSOLUTE): 0.4 10*3/uL (ref 0.0–0.4)
Eos: 7 %
Hematocrit: 33.2 % — ABNORMAL LOW (ref 34.0–46.6)
Hemoglobin: 10.8 g/dL — ABNORMAL LOW (ref 11.1–15.9)
Immature Grans (Abs): 0 10*3/uL (ref 0.0–0.1)
Immature Granulocytes: 0 %
Lymphocytes Absolute: 1.8 10*3/uL (ref 0.7–3.1)
Lymphs: 31 %
MCH: 24.5 pg — ABNORMAL LOW (ref 26.6–33.0)
MCHC: 32.5 g/dL (ref 31.5–35.7)
MCV: 76 fL — ABNORMAL LOW (ref 79–97)
Monocytes Absolute: 0.4 10*3/uL (ref 0.1–0.9)
Monocytes: 6 %
Neutrophils Absolute: 3.2 10*3/uL (ref 1.4–7.0)
Neutrophils: 55 %
Platelets: 278 10*3/uL (ref 150–450)
RBC: 4.4 x10E6/uL (ref 3.77–5.28)
RDW: 19 % — ABNORMAL HIGH (ref 11.7–15.4)
WBC: 5.8 10*3/uL (ref 3.4–10.8)

## 2021-07-18 MED ORDER — METFORMIN HCL ER 500 MG PO TB24: ORAL_TABLET | ORAL | 3 refills | Status: DC

## 2021-07-18 MED ORDER — AGAMATRIX PRESTO TEST VI STRP: ORAL_STRIP | 12 refills | Status: DC

## 2021-07-18 MED ORDER — AGAMATRIX ULTRA-THIN LANCETS MISC: 11 refills | Status: DC

## 2021-07-18 MED ORDER — AGAMATRIX PRESTO W/DEVICE KIT: PACK | 0 refills | Status: AC

## 2021-07-18 MED ORDER — IVERMECTIN 3 MG PO TABS: ORAL_TABLET | ORAL | 0 refills | Status: DC

## 2021-07-18 NOTE — Progress Notes (Signed)
    Subjective:    Patient ID: Kelsey Keith, female   DOB: 1975/06/08, 46 y.o.   MRN: [id number]   HPI   Anemia:  Improved with hemoglobin up to 10.8 and still with mildly decreased MCV at 76.  She is taking ferrous gluconate on and off, but not consistent.  2.  DM:  not sure what her sugars are running as her glucometer does not measure correctly.  She does feel in general, her blood sugars are better.  Doing a bit better with snacking at night.  She is walking 30 minutes daily.  Has been doing this on and off for about 1.5 months.    Taking her metformin ER at night as sugars were high in night and in morning.  She would be willing to take twice daily.  3.  Believes she has larva migrans again.  She and her husband continue to make their on [place].  She has noted the red streak with movement along her left torso  Current Meds  Medication Sig   AgaMatrix Ultra-Thin Lancets MISC Check sugars twice daily before meals   atorvastatin (LIPITOR) 80 MG tablet Take 1 tablet (80 mg total) by mouth daily. (Patient taking differently: Take 80 mg by mouth daily. occasional)   Blood Glucose Monitoring Suppl (AGAMATRIX PRESTO) w/Device KIT Check sugars twice daily before meals (Patient taking differently: Check sugars twice daily before meals. Believes she needs a new one)   cetirizine (ZYRTEC) 10 MG tablet Take 1 tablet (10 mg total) by mouth daily.   ferrous gluconate (FERGON) 324 MG tablet 1 tab by mouth daily with citrus fruit (Patient taking differently: 1 tab by mouth daily with citrus fruit. Misses days)   glucose blood (AGAMATRIX PRESTO TEST) test strip Check sugars twice daily before meals   metFORMIN (GLUCOPHAGE-XR) 500 MG 24 hr tablet Take 1 tablet (500 mg total) by mouth daily with breakfast.   No Known Allergies   Review of Systems    Objective:   BP 106/68 (BP Location: Left Arm, Patient Position: Sitting, Cuff Size: Normal)   Pulse 84   Resp 20   Ht 5'  (1.524 m)   Wt 194 lb (88 kg)   LMP 07/15/2021 (Exact Date)   BMI 37.89 kg/m   Physical Exam NAD HEENT:  PERRL, EOMI Neck:  Supple, No adenopathy Chest:  CTA CV:  RRR without murmur or rub .  Radial and DP pulses normal and equal.   LE:  No edema Skin, Left lateral flank with peau de orange and associated erythema.  Assessment & Plan   DM:  A1C; new monitoring equipment to [facility].   Increase metformin to twice daily Continue to work on lifestyle change Referral for diabetic eye exam.    2.  Cutaneous larva migrans:  Ivermectin 18 mg daily for 2 days  3.  Microcytic anemia:  Continue iron supplementation and to take daily/consistently.

## 2021-07-20 LAB — HGB A1C W/O EAG: Hgb A1c MFr Bld: 7.8 % — ABNORMAL HIGH (ref 4.8–5.6)

## 2021-07-20 LAB — SPECIMEN STATUS REPORT

## 2021-09-10 DIAGNOSIS — M25561 Pain in right knee: Secondary | ICD-10-CM | POA: Insufficient documentation

## 2021-09-10 DIAGNOSIS — M79605 Pain in left leg: Secondary | ICD-10-CM | POA: Insufficient documentation

## 2021-09-10 DIAGNOSIS — M79672 Pain in left foot: Secondary | ICD-10-CM | POA: Insufficient documentation

## 2021-09-10 DIAGNOSIS — I83813 Varicose veins of bilateral lower extremities with pain: Secondary | ICD-10-CM | POA: Insufficient documentation

## 2022-01-23 ENCOUNTER — Encounter: Payer: Self-pay | Admitting: Internal Medicine

## 2022-01-23 ENCOUNTER — Ambulatory Visit: Payer: Self-pay | Admitting: Internal Medicine

## 2022-01-23 VITALS — BP 124/80 | HR 72 | Resp 16 | Ht 60.0 in | Wt 192.0 lb

## 2022-01-23 DIAGNOSIS — G5601 Carpal tunnel syndrome, right upper limb: Secondary | ICD-10-CM

## 2022-01-23 DIAGNOSIS — M779 Enthesopathy, unspecified: Secondary | ICD-10-CM

## 2022-01-23 DIAGNOSIS — M654 Radial styloid tenosynovitis [de Quervain]: Secondary | ICD-10-CM

## 2022-01-23 MED ORDER — IBUPROFEN 200 MG PO TABS: ORAL_TABLET | ORAL | 0 refills | Status: DC

## 2022-01-23 NOTE — Patient Instructions (Signed)
Spica splint for 2 weeks unless washing hands or bathing. Wear splint only at night if pain resolves after 2 weeks and continue to wear at night for 2 months. Ibuprofen dos veces al dia con comida para 14 dias.   Despues 14 dias, solamente cuando necesita

## 2022-01-23 NOTE — Progress Notes (Signed)
Subjective:    Patient ID: Kelsey Keith, female   DOB: 07-06-1975, 46 y.o.   MRN: [id number]   HPI  Kelsey Keith interprets   Right arm pain and swelling:  started 3 weeks ago.  States noted swelling on dorsal wrist and to mid forearm for about 1 week.  Gradually climbed up her arm to upper arm.  She thinks her arm feels warmer to touch.  Has sense of burning along the flexor tendon bundle in wrist.  Notes her whole hand is going to sleep.  Especially at night.  Cannot recall any repetitive movement of hands or wrist prior.  Has never had this before.  She is right handed.  No injury she can recall.    Current Meds  Medication Sig   AgaMatrix Ultra-Thin Lancets MISC Check sugars twice daily before meals   atorvastatin (LIPITOR) 80 MG tablet Take 1 tablet (80 mg total) by mouth daily.   Blood Glucose Monitoring Suppl (AGAMATRIX PRESTO) w/Device KIT Check sugars twice daily before meals   cetirizine (ZYRTEC) 10 MG tablet Take 1 tablet (10 mg total) by mouth daily.   ferrous gluconate (FERGON) 324 MG tablet 1 tab by mouth daily with citrus fruit (Patient taking differently: 1 tab by mouth daily with citrus fruit.)   glucose blood (AGAMATRIX PRESTO TEST) test strip Check sugars twice daily before meals   metFORMIN (GLUCOPHAGE-XR) 500 MG 24 hr tablet 1 tab by mouth twice daily with meals   No Known Allergies   Review of Systems    Objective:   BP 124/80 (BP Location: Left Arm, Patient Position: Sitting, Cuff Size: Normal)   Pulse 72   Resp 16   Ht 5' (1.524 m)   Wt 192 lb (87.1 kg)   LMP 01/15/2022 (Exact Date)   BMI 37.50 kg/m   Physical Exam NAD Right fingers/hand/arm without obvious swelling of soft tissues.  Tender over extensor tendon bundle at dorsal wrist.  Tender over hallucis tendons.  + Finkelstein's test.  Mildly + Tinels and Phalens with median nerve.  Good grip.   Assessment & Plan    Right Carpal Tunnel Syndrome (mild) and tendonitis  of extensor tendons of fingers and De Quervain's tenosynovitis:  SPICA splint for 2 weeks and then at night only if daytime symptoms controlled. Ibuprofen 600 mg twice daily for 2 weeks with meals.

## 2022-02-06 DIAGNOSIS — M779 Enthesopathy, unspecified: Secondary | ICD-10-CM | POA: Insufficient documentation

## 2022-02-06 DIAGNOSIS — G5601 Carpal tunnel syndrome, right upper limb: Secondary | ICD-10-CM | POA: Insufficient documentation

## 2022-02-06 DIAGNOSIS — M654 Radial styloid tenosynovitis [de Quervain]: Secondary | ICD-10-CM | POA: Insufficient documentation

## 2022-02-12 ENCOUNTER — Other Ambulatory Visit: Payer: Self-pay

## 2022-02-12 DIAGNOSIS — E782 Mixed hyperlipidemia: Secondary | ICD-10-CM

## 2022-02-12 DIAGNOSIS — E118 Type 2 diabetes mellitus with unspecified complications: Secondary | ICD-10-CM

## 2022-02-12 DIAGNOSIS — Z79899 Other long term (current) drug therapy: Secondary | ICD-10-CM

## 2022-02-12 DIAGNOSIS — D509 Iron deficiency anemia, unspecified: Secondary | ICD-10-CM

## 2022-02-13 LAB — COMPREHENSIVE METABOLIC PANEL
ALT: 13 IU/L (ref 0–32)
AST: 11 IU/L (ref 0–40)
Albumin/Globulin Ratio: 1.5 (ref 1.2–2.2)
Albumin: 4 g/dL (ref 3.9–4.9)
Alkaline Phosphatase: 69 IU/L (ref 44–121)
BUN/Creatinine Ratio: 18 (ref 9–23)
BUN: 13 mg/dL (ref 6–24)
Bilirubin Total: 0.2 mg/dL (ref 0.0–1.2)
CO2: 20 mmol/L (ref 20–29)
Calcium: 8.7 mg/dL (ref 8.7–10.2)
Chloride: 105 mmol/L (ref 96–106)
Creatinine, Ser: 0.73 mg/dL (ref 0.57–1.00)
Globulin, Total: 2.6 g/dL (ref 1.5–4.5)
Sodium: 138 mmol/L (ref 134–144)
Total Protein: 6.6 g/dL (ref 6.0–8.5)
eGFR: 101 mL/min/{1.73_m2} (ref >59)

## 2022-02-13 LAB — CBC WITH DIFFERENTIAL/PLATELET
Basophils Absolute: 0 10*3/uL (ref 0.0–0.2)
Basos: 1 %
EOS (ABSOLUTE): 0.2 10*3/uL (ref 0.0–0.4)
Eos: 3 %
Hematocrit: 35.2 % (ref 34.0–46.6)
Hemoglobin: 11.5 g/dL (ref 11.1–15.9)
Immature Grans (Abs): 0 10*3/uL (ref 0.0–0.1)
Immature Granulocytes: 0 %
Lymphocytes Absolute: 2.2 10*3/uL (ref 0.7–3.1)
Lymphs: 41 %
MCH: 26.5 pg — ABNORMAL LOW (ref 26.6–33.0)
MCHC: 32.7 g/dL (ref 31.5–35.7)
MCV: 81 fL (ref 79–97)
Monocytes Absolute: 0.4 10*3/uL (ref 0.1–0.9)
Monocytes: 8 %
Neutrophils Absolute: 2.5 10*3/uL (ref 1.4–7.0)
Neutrophils: 47 %
Platelets: 265 10*3/uL (ref 150–450)
RBC: 4.34 x10E6/uL (ref 3.77–5.28)
RDW: 14.7 % (ref 11.7–15.4)
WBC: 5.3 10*3/uL (ref 3.4–10.8)

## 2022-02-13 LAB — LIPID PANEL W/O CHOL/HDL RATIO
Cholesterol, Total: 192 mg/dL (ref 100–199)
HDL: 50 mg/dL (ref >39)
LDL Chol Calc (NIH): 123 mg/dL — ABNORMAL HIGH (ref 0–99)
Triglycerides: 105 mg/dL (ref 0–149)
VLDL Cholesterol Cal: 19 mg/dL (ref 5–40)

## 2022-02-13 LAB — HEMOGLOBIN A1C
Est. average glucose Bld gHb Est-mCnc: 166 mg/dL
Hgb A1c MFr Bld: 7.4 % — ABNORMAL HIGH (ref 4.8–5.6)

## 2022-02-15 ENCOUNTER — Ambulatory Visit: Payer: Self-pay | Admitting: Internal Medicine

## 2022-02-15 ENCOUNTER — Encounter: Payer: Self-pay | Admitting: Internal Medicine

## 2022-02-15 VITALS — BP 112/68 | HR 76 | Resp 20 | Ht 60.75 in | Wt 186.5 lb

## 2022-02-15 DIAGNOSIS — D509 Iron deficiency anemia, unspecified: Secondary | ICD-10-CM

## 2022-02-15 DIAGNOSIS — E118 Type 2 diabetes mellitus with unspecified complications: Secondary | ICD-10-CM

## 2022-02-15 DIAGNOSIS — M654 Radial styloid tenosynovitis [de Quervain]: Secondary | ICD-10-CM

## 2022-02-15 DIAGNOSIS — Z23 Encounter for immunization: Secondary | ICD-10-CM

## 2022-02-15 DIAGNOSIS — E782 Mixed hyperlipidemia: Secondary | ICD-10-CM

## 2022-02-15 DIAGNOSIS — G5601 Carpal tunnel syndrome, right upper limb: Secondary | ICD-10-CM

## 2022-02-15 DIAGNOSIS — H269 Unspecified cataract: Secondary | ICD-10-CM

## 2022-02-15 MED ORDER — EMPAGLIFLOZIN 10 MG PO TABS: 10.0000 mg | ORAL_TABLET | Freq: Every day | ORAL | 11 refills | Status: DC

## 2022-02-15 NOTE — Patient Instructions (Signed)
Return FIT in 2 weeks 

## 2022-02-15 NOTE — Progress Notes (Signed)
Subjective:    Patient ID: Kelsey Keith, female   DOB: 06-Apr-1976, 46 y.o.   MRN: [id number]   HPI  Interpreted   Anemia:  Hemoglobin just in normal range.  Continues to take iron daily with an orange.  Never brought in FIT to test.  Has never had a colonoscopy.  No family history of colon cancer.  Periods remain heavy, "just a teenager"  2.  DM:  A1C down a bit to 7.4%.  Can make changes to improve with physical activity and diet.  Son is chubby.  Husband brings in Co[employer].    3.  Right CTS and De Quervains tenosynovitis:  Spica splint has helped a lot.    4.  Lipids: not at goal Lipid Panel     Component Value Date/Time   CHOL 192 02/12/2022 0843   TRIG 105 02/12/2022 0843   TRIG 135 07/27/2016 1010   HDL 50 02/12/2022 0843   CHOLHDL 5.0 (H) 07/27/2016 1010   CHOLHDL 5.2 Ratio 08/13/2007 2049   VLDL 33 08/13/2007 2049   LDLCALC 123 (H) 02/12/2022 0843   LABVLDL 19 02/12/2022 0843    Current Meds  Medication Sig   AgaMatrix Ultra-Thin Lancets MISC Check sugars twice daily before meals   atorvastatin (LIPITOR) 80 MG tablet Take 1 tablet (80 mg total) by mouth daily.   Blood Glucose Monitoring Suppl (AGAMATRIX PRESTO) w/Device KIT Check sugars twice daily before meals   cetirizine (ZYRTEC) 10 MG tablet Take 1 tablet (10 mg total) by mouth daily.   ferrous gluconate (FERGON) 324 MG tablet 1 tab by mouth daily with citrus fruit (Patient taking differently: 1 tab by mouth daily with citrus fruit.)   glucose blood (AGAMATRIX PRESTO TEST) test strip Check sugars twice daily before meals   ibuprofen (ADVIL) 200 MG tablet 3 tabs by mouth with food twice daily for 14 days, then only as needed.   metFORMIN (GLUCOPHAGE-XR) 500 MG 24 hr tablet 1 tab by mouth twice daily with meals   No Known Allergies   Review of Systems  n  Objective:   BP 112/68 (BP Location: Left Arm, Patient Position: Sitting, Cuff Size: Normal)   Pulse 76   Resp 20   Ht 5' 0.75"  (1.543 m)   Wt 186 lb 8 oz (84.6 kg)   LMP 02/09/2022 (Exact Date)   BMI 35.53 kg/m   Physical Exam NAD HEENT:  Cataracts, left.  PERRL, EOMI Neck:  Supple, No adenopathy Chest:  CTA CV:  RRR without murmur or rub.  No carotid bruits.  Carotid, radial and DP pulses normal and equal Abd:  S, NT, No HSM or mass, + BS LE:  No edema MS:  increased ROM of right thumb with minimal discomfort.    Assessment & Plan   Anemia:  much improved.  Continue iron with orange as continues with heavy periods.  To get FIT in.    2.  DM:  Encouraged her to make family rules about avoiding unhealthy foods in home Add Jardiance 10 mg daily.    3.  HM:  Spikevax and Afluria today. Return for Hep A and B  4.  Cataracts:   left eye.[place] [facility] recommended removal.  [facility] to [facility]  5.  DeQuervains Tenosynovitis and CTS on right:  much improved.  To wear splint at night.    6.  Hyperlipidemia:  not clear she is taking Atorvastatin regularly.  Needs to take daily and repeat FLP in  4 months.  Work on diet and physical activity.

## 2022-03-22 ENCOUNTER — Ambulatory Visit (INDEPENDENT_AMBULATORY_CARE_PROVIDER_SITE_OTHER): Payer: Self-pay | Admitting: Internal Medicine

## 2022-03-22 DIAGNOSIS — Z23 Encounter for immunization: Secondary | ICD-10-CM

## 2022-05-21 ENCOUNTER — Other Ambulatory Visit: Payer: Self-pay

## 2022-05-24 ENCOUNTER — Other Ambulatory Visit: Payer: Self-pay

## 2022-05-24 DIAGNOSIS — E118 Type 2 diabetes mellitus with unspecified complications: Secondary | ICD-10-CM

## 2022-05-24 DIAGNOSIS — E782 Mixed hyperlipidemia: Secondary | ICD-10-CM

## 2022-05-25 LAB — LIPID PANEL W/O CHOL/HDL RATIO
Cholesterol, Total: 163 mg/dL (ref 100–199)
HDL: 52 mg/dL (ref >39)
LDL Chol Calc (NIH): 92 mg/dL (ref 0–99)
Triglycerides: 102 mg/dL (ref 0–149)
VLDL Cholesterol Cal: 19 mg/dL (ref 5–40)

## 2022-05-25 LAB — HEMOGLOBIN A1C
Est. average glucose Bld gHb Est-mCnc: 169 mg/dL
Hgb A1c MFr Bld: 7.5 % — ABNORMAL HIGH (ref 4.8–5.6)

## 2022-05-29 ENCOUNTER — Encounter: Payer: Self-pay | Admitting: Internal Medicine

## 2022-05-29 ENCOUNTER — Ambulatory Visit: Payer: Self-pay | Admitting: Internal Medicine

## 2022-05-29 VITALS — BP 116/70 | HR 76 | Resp 16 | Ht 60.75 in | Wt 189.0 lb

## 2022-05-29 DIAGNOSIS — E782 Mixed hyperlipidemia: Secondary | ICD-10-CM

## 2022-05-29 DIAGNOSIS — Z9189 Other specified personal risk factors, not elsewhere classified: Secondary | ICD-10-CM

## 2022-05-29 DIAGNOSIS — Z1231 Encounter for screening mammogram for malignant neoplasm of breast: Secondary | ICD-10-CM

## 2022-05-29 DIAGNOSIS — E118 Type 2 diabetes mellitus with unspecified complications: Secondary | ICD-10-CM

## 2022-05-29 DIAGNOSIS — G4730 Sleep apnea, unspecified: Secondary | ICD-10-CM

## 2022-05-29 DIAGNOSIS — Z0000 Encounter for general adult medical examination without abnormal findings: Secondary | ICD-10-CM

## 2022-05-29 MED ORDER — EMPAGLIFLOZIN 10 MG PO TABS: 10.0000 mg | ORAL_TABLET | Freq: Every day | ORAL | 11 refills | Status: DC

## 2022-05-29 NOTE — Progress Notes (Signed)
Subjective:    Patient ID: Kelsey Keith, female   DOB: 26-Nov-1975, 47 y.o.   MRN: [id number]   HPI  Tildon Husky interprets  CPE without pap  1.  Pap:  Last 03/2021 and normal.    2.  Mammogram:  Last mammogram 2019 and normal.  No family history of breast cancer.   3.  Osteoprevention:  Drinking 2 cups almond milk daily and eating serving of cheese.  She is not physically active.  She has started to help her adult daughter pick up trash on [address] where they live.    4.  Guaiac Cards/FIT:  Last checked 07/2017 and negative.   5.  Colonoscopy:  Never.  No family history of colon cancer.   6.  Immunizations:  Needs 3rd Hep B Immunization History  Administered Date(s) Administered   Covid-19, Mrna,Vaccine(Spikevax)47yrs and older 02/15/2022   Hep A / Hep B 04/10/2021   Hepatitis A, Adult 03/22/2022   Hepatitis B, adult 03/22/2022   Influenza Inj Mdck Quad With Preservative 02/17/2020   Influenza,inj,Quad PF,6+ Mos 02/15/2022   Influenza-Unspecified 02/05/2019   PFIZER(Purple Top)SARS-COV-2 Vaccination 07/01/2019, 07/22/2019   Pfizer Covid-19 Vaccine Bivalent Booster 94yrs & up 04/10/2021   Pneumococcal Polysaccharide-23 08/26/2018   Tdap 03/16/2014     7.  Glucose/Cholesterol:  A1C not at goal, but did not go in and apply for MAP to obtain Jardiance.  Discussed LDL is not at goal at 92.  She is at max with Atorvastatin.  Also taking dance classes with husband Alayn, who needs to dance as well.  She is also exercising with her daughter.   Lipid Panel     Component Value Date/Time   CHOL 163 05/24/2022 0829   TRIG 102 05/24/2022 0829   TRIG 135 07/27/2016 1010   HDL 52 05/24/2022 0829   CHOLHDL 5.0 (H) 07/27/2016 1010   CHOLHDL 5.2 Ratio 08/13/2007 2049   VLDL 33 08/13/2007 2049   LDLCALC 92 05/24/2022 0829   LABVLDL 19 05/24/2022 0829       Current Meds  Medication Sig   AgaMatrix Ultra-Thin Lancets MISC Check sugars twice daily before  meals   atorvastatin (LIPITOR) 80 MG tablet Take 1 tablet (80 mg total) by mouth daily.   Blood Glucose Monitoring Suppl (AGAMATRIX PRESTO) w/Device KIT Check sugars twice daily before meals   cetirizine (ZYRTEC) 10 MG tablet Take 1 tablet (10 mg total) by mouth daily.   ferrous gluconate (FERGON) 324 MG tablet 1 tab by mouth daily with citrus fruit (Patient taking differently: 1 tab by mouth daily with citrus fruit.)   glucose blood (AGAMATRIX PRESTO TEST) test strip Check sugars twice daily before meals   metFORMIN (GLUCOPHAGE-XR) 500 MG 24 hr tablet 1 tab by mouth twice daily with meals   No Known Allergies  Past Medical History:  Diagnosis Date   Depression age 48 yo and 4   First episode age 24 yo, then another after child born 2002.  Was treated for 5 years then with Zoloft.  Has not needed treatment since 2007.   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   Past Surgical History:  Procedure Laterality Date   CESAREAN SECTION  1999, 2002,2012   TUBAL LIGATION  2012   Family History  Problem Relation Age of Onset   Heart disease Mother    Stroke Mother    Hypertension  Mother    Heart disease Father    Stroke Father    Diabetes Brother    Irritable bowel syndrome Daughter    Breast cancer Neg Hx    Social History   Socioeconomic History   Marital status: Married    Spouse name: Alayn   Number of children: 3   Years of education: 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   Highest education level: Not on file  Occupational History   Occupation: housecleaning  Tobacco Use   Smoking status: Former    Current packs/day: 0.00    Average packs/day: 1 pack/day for 20.0 years (20.0 ttl pk-yrs)    Types: Cigarettes    Start date: 07/24/1992    Quit date: 07/24/2012    Years since quitting: 10.3    Passive exposure: Past   Smokeless tobacco: Never  Vaping Use   Vaping status: Never Used   Substance and Sexual Activity   Alcohol use: No   Drug use: No   Sexual activity: Yes    Birth control/protection: Surgical  Other Topics Concern   Not on file  Social History Narrative   Originally from [place]   Came to [employer] [place] [employer]. In 2000   Lives at home husband, [place], and 3 children.   Social Determinants of Health   Financial Resource Strain: Low Risk  (05/29/2022)   Overall Financial Resource Strain (CARDIA)    Difficulty of Paying Living Expenses: Not hard at all  Food Insecurity: No Food Insecurity (05/29/2022)   Hunger Vital Sign    Worried About Running Out of Food in the Last Year: Never true    Ran Out of Food in the Last Year: Never true  Transportation Needs: No Transportation Needs (05/29/2022)   PRAPARE - Administrator, Civil Service (Medical): No    Lack of Transportation (Non-Medical): No  Physical Activity: Not on file  Stress: Not on file  Social Connections: Not on file  Intimate Partner Violence: Not At Risk (05/29/2022)   Humiliation, Afraid, Rape, and Kick questionnaire    Fear of Current or Ex-Partner: No    Emotionally Abused: No    Physically Abused: No    Sexually Abused: No      Review of Systems  HENT:  Negative for dental problem.   Eyes:  Positive for visual disturbance (Has appt with [facility] for cataract eval on left).  Respiratory:  Negative for shortness of breath.        Snores loudly. Wakes her sometimes.  Describes apneic episodes.    Cardiovascular:  Negative for chest pain.  Gastrointestinal:  Negative for abdominal pain and blood in stool (No melena).  Genitourinary:  Positive for menstrual problem (Chronic heavy periods.  Taking iron).  Neurological:  Negative for weakness and numbness.  Psychiatric/Behavioral:  Negative for dysphoric mood. The patient is not nervous/anxious.       Objective:   BP 116/70 (BP Location: Left Arm, Patient Position: Sitting, Cuff Size: Normal)   Pulse 76   Resp 16   Ht 5' 0.75" (1.543  m)   Wt 189 lb (85.7 kg)   LMP 05/28/2022 (Exact Date)   BMI 36.01 kg/m   Physical Exam Constitutional:      Appearance: She is obese.  HENT:     Head: Normocephalic and atraumatic.     Right Ear: Tympanic membrane, ear canal and external ear normal.     Left Ear: Tympanic membrane, ear canal and external ear normal.     Nose:  Nose normal.     Mouth/Throat:     Mouth: Mucous membranes are moist.     Pharynx: Oropharynx is clear.  Eyes:     Extraocular Movements: Extraocular movements intact.     Conjunctiva/sclera: Conjunctivae normal.     Pupils: Pupils are equal, round, and reactive to light.     Comments: Cataracts on left. Right disc sharp  Neck:     Thyroid: No thyroid mass or thyromegaly.  Cardiovascular:     Rate and Rhythm: Normal rate and regular rhythm.     Pulses:          Dorsalis pedis pulses are 2+ on the right side and 2+ on the left side.     Heart sounds: S1 normal and S2 normal. No murmur heard.    No friction rub. No S3 or S4 sounds.     Comments: No carotid bruits.  Carotid, radial, femoral, DP and PT pulses normal and equal.   Pulmonary:     Effort: Pulmonary effort is normal.     Breath sounds: Normal breath sounds and air entry.  Chest:  Breasts:    Right: No inverted nipple, mass or nipple discharge.     Left: No inverted nipple, mass or nipple discharge.  Abdominal:     General: Bowel sounds are normal.     Palpations: Abdomen is soft. There is no hepatomegaly, splenomegaly or mass.     Tenderness: There is no abdominal tenderness.     Hernia: No hernia is present.  Genitourinary:    Comments: Patient having heavy period flow and did not want GU exam today. Musculoskeletal:        General: Normal range of motion.     Cervical back: Normal range of motion and neck supple.     Right lower leg: No edema.     Left lower leg: No edema.  Feet:     Right foot:     Protective Sensation: 10 sites tested.  10 sites sensed.     Skin integrity: Skin  integrity normal.     Toenail Condition: Right toenails are normal.     Left foot:     Protective Sensation: 10 sites tested.  10 sites sensed.     Skin integrity: Skin integrity normal.     Toenail Condition: Left toenails are normal.  Lymphadenopathy:     Head:     Right side of head: No submental or submandibular adenopathy.     Left side of head: No submental or submandibular adenopathy.     Cervical: No cervical adenopathy.     Upper Body:     Right upper body: No supraclavicular or axillary adenopathy.     Left upper body: No supraclavicular or axillary adenopathy.     Lower Body: No right inguinal adenopathy. No left inguinal adenopathy.  Skin:    General: Skin is warm.     Capillary Refill: Capillary refill takes less than 2 seconds.     Findings: No lesion or rash.  Neurological:     General: No focal deficit present.     Mental Status: She is alert and oriented to person, place, and time.     Cranial Nerves: Cranial nerves 2-12 are intact.     Sensory: Sensation is intact.     Motor: Motor function is intact.     Coordination: Coordination is intact.     Gait: Gait is intact.     Deep Tendon Reflexes: Reflexes are normal and  symmetric.  Psychiatric:        Speech: Speech normal.        Behavior: Behavior normal. Behavior is cooperative.      Assessment & Plan   CPE no pap Schedule follow up for pelvic and pap to complete exam FIT given again and to return in 2 weeks Mammogram ordered Dental referral through [facility] for dental care. Needs Hep B #3--out today.  2.  DM:  Still not at goal.  Not clear why she is not getting to [facility] pharm to apply for Jardiance.  MAP information given and how to apply.    3.  Probable Sleep apnea:  referral for split night sleep study.  Paperwork given for [employer].    4.  Left cataracts:  Await [facility] evaluation.  Paperwork for financial assistance at [facility] as well.    5.  Hyperlipidemia:  Not compliant with other  meds, so likely not with high dose Atorvastatin as well.  Not at goal.  Discussed.  Not clear what she is doing.    Encouraged her to work on above and lifestyle changes and return in 4 months for fasting labs to re evaluate.

## 2022-06-12 ENCOUNTER — Telehealth: Payer: Self-pay

## 2022-06-12 NOTE — Telephone Encounter (Signed)
Telephoned patient using language line interpreter [phone]. Telephone answered and call released. [facility] unable to leave a message.

## 2022-07-05 ENCOUNTER — Ambulatory Visit (HOSPITAL_BASED_OUTPATIENT_CLINIC_OR_DEPARTMENT_OTHER): Payer: Self-pay | Attending: Internal Medicine | Admitting: Internal Medicine

## 2022-07-05 VITALS — Ht 61.0 in | Wt 193.0 lb

## 2022-07-05 DIAGNOSIS — G4730 Sleep apnea, unspecified: Secondary | ICD-10-CM

## 2022-07-05 DIAGNOSIS — E119 Type 2 diabetes mellitus without complications: Secondary | ICD-10-CM | POA: Insufficient documentation

## 2022-07-05 DIAGNOSIS — I10 Essential (primary) hypertension: Secondary | ICD-10-CM | POA: Insufficient documentation

## 2022-07-05 DIAGNOSIS — G4733 Obstructive sleep apnea (adult) (pediatric): Secondary | ICD-10-CM | POA: Insufficient documentation

## 2022-07-05 DIAGNOSIS — E669 Obesity, unspecified: Secondary | ICD-10-CM | POA: Insufficient documentation

## 2022-07-05 DIAGNOSIS — Z6836 Body mass index (BMI) 36.0-36.9, adult: Secondary | ICD-10-CM | POA: Insufficient documentation

## 2022-07-08 DIAGNOSIS — G4730 Sleep apnea, unspecified: Secondary | ICD-10-CM

## 2022-07-08 NOTE — Procedures (Signed)
Patient Name: Kelsey Keith, Kelsey Keith Study Date: 07/05/2022 Gender: Female D.O.B: 05-31-1975 Age (years): 47 Referring Provider: Mack Hook Height (inches): 61 Interpreting Physician: Baird Lyons MD, ABSM Weight (lbs): 193 RPSGT: Jorge Ny BMI: 36 MRN: [id number] Neck Size: 12.75  CLINICAL INFORMATION Sleep Study Type: Split Night CPAP Indication for sleep study: Diabetes, Hypertension, Obesity, Snoring Epworth Sleepiness Score: 6  SLEEP STUDY TECHNIQUE As per the AASM Manual for the Scoring of Sleep and Associated Events v2.3 (April 2016) with a hypopnea requiring 4% desaturations.  The channels recorded and monitored were frontal, central and occipital EEG, electrooculogram (EOG), submentalis EMG (chin), nasal and oral airflow, thoracic and abdominal wall motion, anterior tibialis EMG, snore microphone, electrocardiogram, and pulse oximetry. Continuous positive airway pressure (CPAP) was initiated when the patient met split night criteria and was titrated according to treat sleep-disordered breathing.  MEDICATIONS Medications self-administered by patient taken the night of the study : none reported  RESPIRATORY PARAMETERS Diagnostic  Total AHI (/hr): 21.0 RDI (/hr): 34.5 OA Index (/hr): - CA Index (/hr): 0.0 REM AHI (/hr): 0.0 NREM AHI (/hr): 23.5 Supine AHI (/hr): 38.5 Non-supine AHI (/hr): 10.2 Min O2 Sat (%): 89.0 Mean O2 (%): 95.0 Time below 88% (min): 0   Titration  Optimal Pressure (cm):  AHI at Optimal Pressure (/hr): N/A Min O2 at Optimal Pressure (%): 91.0 Supine % at Optimal (%): N/A Sleep % at Optimal (%): N/A   SLEEP ARCHITECTURE The recording time for the entire night was 444.3 minutes.  During a baseline period of 249.3 minutes, the patient slept for 142.5 minutes in REM and nonREM, yielding a sleep efficiency of 57.2%. Sleep onset after lights out was 69.3 minutes with a REM latency of 134.5 minutes. The patient spent  8.4% of the night in stage N1 sleep, 81.0% in stage N2 sleep, 0.0% in stage N3 and 10.5% in REM.  During the titration period of 187.7 minutes, the patient slept for 158.5 minutes in REM and nonREM, yielding a sleep efficiency of 84.5%. Sleep onset after CPAP initiation was 4.1 minutes with a REM latency of 48.0 minutes. The patient spent 9.1% of the night in stage N1 sleep, 71.6% in stage N2 sleep, 0.0% in stage N3 and 19.2% in REM.  CARDIAC DATA The 2 lead EKG demonstrated sinus rhythm. The mean heart rate was 100.0 beats per minute. Other EKG findings include: None.  LEG MOVEMENT DATA The total Periodic Limb Movements of Sleep (PLMS) were 0. The PLMS index was 0.0 .  IMPRESSIONS - Moderate obstructive sleep apnea occurred during the diagnostic portion of the study(AHI = 21.0/hour). An optimal PAP pressure was selected 7 cwp. - The patient had minimal or no oxygen desaturation during the diagnostic portion of the study (Min O2 = 89.0%).  Minimum O2 saturation on CPAP 7 was 91%. - The patient snored with moderate snoring volume during the diagnostic portion of the study. - No cardiac abnormalities were noted during this study. - Clinically significant periodic limb movements did not occur during sleep.  DIAGNOSIS - Obstructive Sleep Apnea (G47.33)  RECOMMENDATIONS - Suggest CPAP 7 cwp or autopap 5- 15 cwp.  - Patient wore a small-wide ResMed AirFit [phone] with heated humidity. - Be careful with alcohol, sedatives and other CNS depressants that may worsen sleep apnea and disrupt normal sleep architecture. - Sleep hygiene should be reviewed to assess factors that may improve sleep quality. - Weight management and regular exercise should be initiated or continued.  [Electronically  signed] 07/08/2022 10:39 AM  Baird Lyons MD, ABSM Diplomate, American Board of Sleep Medicine NPI: [id number]                        [place] [place] [facility]  ELECTRONICALLY SIGNED ON:  07/08/2022, 10:34 AM [place] PH: [phone]   FX: [phone] [place]

## 2022-07-09 ENCOUNTER — Ambulatory Visit: Payer: Self-pay | Admitting: Podiatry

## 2022-09-28 ENCOUNTER — Other Ambulatory Visit: Payer: Self-pay

## 2022-10-01 ENCOUNTER — Ambulatory Visit: Payer: Self-pay | Admitting: Internal Medicine

## 2022-10-16 ENCOUNTER — Ambulatory Visit: Payer: Self-pay | Admitting: Internal Medicine

## 2022-10-16 ENCOUNTER — Encounter: Payer: Self-pay | Admitting: Internal Medicine

## 2022-10-16 VITALS — BP 114/74 | HR 71 | Resp 16 | Ht 60.75 in | Wt 184.0 lb

## 2022-10-16 DIAGNOSIS — F339 Major depressive disorder, recurrent, unspecified: Secondary | ICD-10-CM

## 2022-10-16 DIAGNOSIS — F321 Major depressive disorder, single episode, moderate: Secondary | ICD-10-CM

## 2022-10-16 MED ORDER — ESCITALOPRAM OXALATE 10 MG PO TABS: 10.0000 mg | ORAL_TABLET | Freq: Every day | ORAL | 4 refills | Status: DC

## 2022-10-16 MED ORDER — ESCITALOPRAM OXALATE 10 MG PO TABS: 10.0000 mg | ORAL_TABLET | Freq: Every day | ORAL | 11 refills | Status: DC

## 2022-10-16 NOTE — Progress Notes (Unsigned)
    Subjective:    Patient ID: Kelsey Keith, female   DOB: Mar 19, 1976, 47 y.o.   MRN: [id number]   HPI  Feeling down, tearful.  2 young adult daughters have left the home--one in [place] and another recently married.  Doesn't care about things she normally cares about.  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 Previous episode at age 69 and living at home.  Was not diagnosed adequately.  Did take medication then.  Current Meds  Medication Sig   AgaMatrix Ultra-Thin Lancets MISC Check sugars twice daily before meals   atorvastatin (LIPITOR) 80 MG tablet Take 1 tablet (80 mg total) by mouth daily.   Blood Glucose Monitoring Suppl (AGAMATRIX PRESTO) w/Device KIT Check sugars twice daily before meals   cetirizine (ZYRTEC) 10 MG tablet Take 1 tablet (10 mg total) by mouth daily.   ferrous gluconate (FERGON) 324 MG tablet 1 tab by mouth daily with citrus fruit (Patient taking differently: 1 tab by mouth daily with citrus fruit.)   glucose blood (AGAMATRIX PRESTO TEST) test strip Check sugars twice daily before meals   metFORMIN (GLUCOPHAGE-XR) 500 MG 24 hr tablet 1 tab by mouth twice daily with meals   No Known Allergies   Review of Systems    Objective:   BP 114/74 (BP Location: Right Arm, Patient Position: Sitting, Cuff Size: Normal)   Pulse 71   Resp 16   Ht 5' 0.75" (1.543 m)   Wt 184 lb (83.5 kg)   BMI 35.05 kg/m   Physical Exam   Assessment & Plan

## 2022-10-19 ENCOUNTER — Telehealth: Payer: Self-pay | Admitting: Psychology

## 2022-10-22 ENCOUNTER — Telehealth: Payer: Self-pay | Admitting: Psychology

## 2022-10-23 NOTE — Telephone Encounter (Signed)
Patient has been instructed to stop medication and to call us once rash clears. Will discuss options then.

## 2022-10-23 NOTE — Telephone Encounter (Signed)
Patient also reports that medication has caused a rash which started the same day of starting medication. Rash is itchy, red, rash is all over body. Patient started medication on 10/17/2022. Patient has also had tremors which mainly happen at night. Tremors happen at random. Patient would like to stop medication.

## 2022-10-30 ENCOUNTER — Encounter: Payer: Self-pay | Admitting: Internal Medicine

## 2022-10-30 ENCOUNTER — Ambulatory Visit: Payer: Self-pay | Admitting: Internal Medicine

## 2022-10-30 VITALS — BP 114/70 | HR 64 | Resp 12 | Ht 60.75 in | Wt 184.0 lb

## 2022-10-30 DIAGNOSIS — E118 Type 2 diabetes mellitus with unspecified complications: Secondary | ICD-10-CM

## 2022-10-30 DIAGNOSIS — F339 Major depressive disorder, recurrent, unspecified: Secondary | ICD-10-CM

## 2022-10-30 DIAGNOSIS — E782 Mixed hyperlipidemia: Secondary | ICD-10-CM

## 2022-10-30 MED ORDER — SERTRALINE HCL 50 MG PO TABS: ORAL_TABLET | ORAL | 3 refills | Status: DC

## 2022-10-30 MED ORDER — ATORVASTATIN CALCIUM 80 MG PO TABS: 80.0000 mg | ORAL_TABLET | Freq: Every day | ORAL | 3 refills | Status: DC

## 2022-10-30 NOTE — Progress Notes (Signed)
    Subjective:    Patient ID: Kelsey Keith, female   DOB: [birth year], 47 y.o.   MRN: [id number]   HPI  [name] interprets   Depression:  did not tolerate the escitalopram--ants crawling on face, ultimately pruritic rash over elbows, which has improved ove 6 days she has been off the escitalopram.  Also had abdominal pains and odd sensations.   She also describes difficulties with startling awake during sleep hours with palpitations.  Current Meds  Medication Sig   AgaMatrix Ultra-Thin Lancets MISC Check sugars twice daily before meals   atorvastatin (LIPITOR) 80 MG tablet Take 1 tablet (80 mg total) by mouth daily.   Blood Glucose Monitoring Suppl (AGAMATRIX PRESTO) w/Device KIT Check sugars twice daily before meals   cetirizine (ZYRTEC) 10 MG tablet Take 1 tablet (10 mg total) by mouth daily.   ferrous gluconate (FERGON) 324 MG tablet 1 tab by mouth daily with citrus fruit (Patient taking differently: 1 tab by mouth daily with citrus fruit.)   glucose blood (AGAMATRIX PRESTO TEST) test strip Check sugars twice daily before meals   metFORMIN (GLUCOPHAGE-XR) 500 MG 24 hr tablet 1 tab by mouth twice daily with meals   No Known Allergies   Review of Systems    Objective:   BP 114/70 (BP Location: Right Arm, Patient Position: Sitting, Cuff Size: Normal)   Pulse 64   Resp 12   Ht 5' 0.75" (1.543 m)   Wt 184 lb (83.5 kg)   BMI 35.05 kg/m   Physical Exam NAD Appears slightly fatigued   Assessment & Plan   Depression with anxiety:  switch to Zoloft 25 mg daily for 7 days, then increase to 50 mg.  Follow up in 2 weeks.  Did not tolerated escitalopram.  My chart video in 2 weeks.  2.  DM:  return for fasting labs--A1C in 6 weeks  3.  Hyperlipidemia:  improved but not at goal in Feb.  FLP with fasting labs 6 w.

## 2022-11-02 ENCOUNTER — Telehealth: Payer: Self-pay

## 2022-11-02 NOTE — Telephone Encounter (Signed)
Patient had a sleep study done earlier this year and would like to know if she requires a cpap machine.

## 2022-11-13 ENCOUNTER — Ambulatory Visit (INDEPENDENT_AMBULATORY_CARE_PROVIDER_SITE_OTHER): Payer: Self-pay | Admitting: Psychology

## 2022-11-13 DIAGNOSIS — F339 Major depressive disorder, recurrent, unspecified: Secondary | ICD-10-CM

## 2022-11-20 ENCOUNTER — Telehealth: Payer: Self-pay | Admitting: Internal Medicine

## 2022-12-13 ENCOUNTER — Other Ambulatory Visit: Payer: Self-pay

## 2022-12-13 DIAGNOSIS — E118 Type 2 diabetes mellitus with unspecified complications: Secondary | ICD-10-CM

## 2022-12-13 DIAGNOSIS — E782 Mixed hyperlipidemia: Secondary | ICD-10-CM

## 2022-12-14 LAB — LIPID PANEL W/O CHOL/HDL RATIO
Cholesterol, Total: 210 mg/dL — ABNORMAL HIGH (ref 100–199)
HDL: 54 mg/dL (ref >39)
LDL Chol Calc (NIH): 132 mg/dL — ABNORMAL HIGH (ref 0–99)
Triglycerides: 133 mg/dL (ref 0–149)
VLDL Cholesterol Cal: 24 mg/dL (ref 5–40)

## 2022-12-14 LAB — HEMOGLOBIN A1C
Est. average glucose Bld gHb Est-mCnc: 180 mg/dL
Hgb A1c MFr Bld: 7.9 % — ABNORMAL HIGH (ref 4.8–5.6)

## 2022-12-17 ENCOUNTER — Ambulatory Visit: Payer: Self-pay | Admitting: Internal Medicine

## 2022-12-17 VITALS — BP 120/68 | HR 90 | Resp 16 | Ht 60.75 in | Wt 188.0 lb

## 2022-12-17 DIAGNOSIS — E782 Mixed hyperlipidemia: Secondary | ICD-10-CM

## 2022-12-17 DIAGNOSIS — F339 Major depressive disorder, recurrent, unspecified: Secondary | ICD-10-CM

## 2022-12-17 DIAGNOSIS — E118 Type 2 diabetes mellitus with unspecified complications: Secondary | ICD-10-CM

## 2022-12-17 DIAGNOSIS — G4733 Obstructive sleep apnea (adult) (pediatric): Secondary | ICD-10-CM

## 2022-12-17 MED ORDER — ATORVASTATIN CALCIUM 80 MG PO TABS: 80.0000 mg | ORAL_TABLET | Freq: Every day | ORAL | 3 refills | Status: DC

## 2022-12-17 NOTE — Progress Notes (Signed)
    Subjective:    Patient ID: Kelsey Keith, female   DOB: May 20, 1975, 47 y.o.   MRN: [id number]   HPI  Kelsey Keith inteprets   Depression:  doing well with Sertraline 50 mg daily.  Has been taking since 9th of July.  Trigger seems to have been her daughters growing up and leaving the home.  Scored 0 on PHQ9  2.  Moderate OSA on sleep study in March that was forgotten with her depression.  She did have a split night study and recommendations for equipment and pressures.   3.  DM:  A1C up to 7.9%  4.  Hyperlipidemia:   Cholesterol way up.  Suspect missing statin frequently.    Current Meds  Medication Sig   AgaMatrix Ultra-Thin Lancets MISC Check sugars twice daily before meals   atorvastatin (LIPITOR) 80 MG tablet Take 1 tablet (80 mg total) by mouth daily.   Blood Glucose Monitoring Suppl (AGAMATRIX PRESTO) w/Device KIT Check sugars twice daily before meals   cetirizine (ZYRTEC) 10 MG tablet Take 1 tablet (10 mg total) by mouth daily.   ferrous gluconate (FERGON) 324 MG tablet 1 tab by mouth daily with citrus fruit (Patient taking differently: 1 tab by mouth daily with citrus fruit.)   glucose blood (AGAMATRIX PRESTO TEST) test strip Check sugars twice daily before meals   metFORMIN (GLUCOPHAGE-XR) 500 MG 24 hr tablet 1 tab by mouth twice daily with meals   sertraline (ZOLOFT) 50 MG tablet 1/2 tab by mouth daily for 7 days then increase to 1 tab daily   [DISCONTINUED] atorvastatin (LIPITOR) 80 MG tablet Take 1 tablet (80 mg total) by mouth daily.   No Known Allergies   Review of Systems    Objective:   BP 120/68 (BP Location: Left Arm, Patient Position: Sitting, Cuff Size: Normal)   Pulse 90   Resp 16   Ht 5' 0.75" (1.543 m)   Wt 188 lb (85.3 kg)   BMI 35.82 kg/m   Physical Exam NAD Happy PHQ9 :  0  Lungs:  CTA CV:  RRR  Assessment & Plan  Depression:  much improved with switch to Sertraline.  CPM.    2.  OSA:  Need to investigate low  cost options for CPAP.    3.  DM:  A1C up to 7.9%.  Perhaps depression has caused issues with lifestyle.  Work on diet and physical activity.  Repeat A1C in 3 months.  4.  Hyperlipidemia:  as in #3.  Refill statin and recheck FLP in 3 months.

## 2022-12-19 ENCOUNTER — Other Ambulatory Visit: Payer: Self-pay | Admitting: Psychology

## 2022-12-25 ENCOUNTER — Encounter: Payer: Self-pay | Admitting: Internal Medicine

## 2023-01-11 ENCOUNTER — Other Ambulatory Visit: Payer: Self-pay

## 2023-01-11 DIAGNOSIS — E782 Mixed hyperlipidemia: Secondary | ICD-10-CM

## 2023-01-11 DIAGNOSIS — Z79899 Other long term (current) drug therapy: Secondary | ICD-10-CM

## 2023-01-11 DIAGNOSIS — E118 Type 2 diabetes mellitus with unspecified complications: Secondary | ICD-10-CM

## 2023-01-13 LAB — CBC WITH DIFFERENTIAL/PLATELET
Basophils Absolute: 0 10*3/uL (ref 0.0–0.2)
Basos: 1 %
EOS (ABSOLUTE): 0.2 10*3/uL (ref 0.0–0.4)
Eos: 3 %
Hematocrit: 36.1 % (ref 34.0–46.6)
Hemoglobin: 11.1 g/dL (ref 11.1–15.9)
Immature Grans (Abs): 0 10*3/uL (ref 0.0–0.1)
Immature Granulocytes: 0 %
Lymphocytes Absolute: 2.1 10*3/uL (ref 0.7–3.1)
Lymphs: 33 %
MCH: 24.5 pg — ABNORMAL LOW (ref 26.6–33.0)
MCHC: 30.7 g/dL — ABNORMAL LOW (ref 31.5–35.7)
MCV: 80 fL (ref 79–97)
Monocytes Absolute: 0.5 10*3/uL (ref 0.1–0.9)
Monocytes: 7 %
Neutrophils Absolute: 3.6 10*3/uL (ref 1.4–7.0)
Neutrophils: 56 %
Platelets: 280 10*3/uL (ref 150–450)
RBC: 4.53 x10E6/uL (ref 3.77–5.28)
RDW: 15.1 % (ref 11.7–15.4)
WBC: 6.4 10*3/uL (ref 3.4–10.8)

## 2023-01-13 LAB — MICROALBUMIN / CREATININE URINE RATIO: Creatinine, Urine: 63.7 mg/dL

## 2023-01-13 LAB — COMPREHENSIVE METABOLIC PANEL
ALT: 15 IU/L (ref 0–32)
AST: 15 IU/L (ref 0–40)
Albumin: 3.8 g/dL — ABNORMAL LOW (ref 3.9–4.9)
Alkaline Phosphatase: 69 IU/L (ref 44–121)
BUN/Creatinine Ratio: 12 (ref 9–23)
BUN: 9 mg/dL (ref 6–24)
Bilirubin Total: 0.4 mg/dL (ref 0.0–1.2)
CO2: 22 mmol/L (ref 20–29)
Calcium: 8.8 mg/dL (ref 8.7–10.2)
Chloride: 106 mmol/L (ref 96–106)
Creatinine, Ser: 0.74 mg/dL (ref 0.57–1.00)
Globulin, Total: 2.9 g/dL (ref 1.5–4.5)
Glucose: 164 mg/dL — ABNORMAL HIGH (ref 70–99)
Potassium: 4.2 mmol/L (ref 3.5–5.2)
Sodium: 139 mmol/L (ref 134–144)
Total Protein: 6.7 g/dL (ref 6.0–8.5)
eGFR: 99 mL/min/{1.73_m2} (ref >59)

## 2023-01-13 LAB — HEMOGLOBIN A1C
Est. average glucose Bld gHb Est-mCnc: 180 mg/dL
Hgb A1c MFr Bld: 7.9 % — ABNORMAL HIGH (ref 4.8–5.6)

## 2023-01-13 LAB — LIPID PANEL W/O CHOL/HDL RATIO
Cholesterol, Total: 214 mg/dL — ABNORMAL HIGH (ref 100–199)
HDL: 57 mg/dL (ref >39)
LDL Chol Calc (NIH): 139 mg/dL — ABNORMAL HIGH (ref 0–99)
Triglycerides: 100 mg/dL (ref 0–149)
VLDL Cholesterol Cal: 18 mg/dL (ref 5–40)

## 2023-01-14 ENCOUNTER — Encounter: Payer: Self-pay | Admitting: Internal Medicine

## 2023-01-14 ENCOUNTER — Ambulatory Visit: Payer: Self-pay | Admitting: Internal Medicine

## 2023-01-14 VITALS — BP 104/62 | HR 70 | Resp 16 | Ht 60.75 in | Wt 184.0 lb

## 2023-01-14 DIAGNOSIS — E118 Type 2 diabetes mellitus with unspecified complications: Secondary | ICD-10-CM

## 2023-01-14 DIAGNOSIS — Z6836 Body mass index (BMI) 36.0-36.9, adult: Secondary | ICD-10-CM

## 2023-01-14 DIAGNOSIS — Z23 Encounter for immunization: Secondary | ICD-10-CM

## 2023-01-14 DIAGNOSIS — E6609 Other obesity due to excess calories: Secondary | ICD-10-CM

## 2023-01-14 DIAGNOSIS — F339 Major depressive disorder, recurrent, unspecified: Secondary | ICD-10-CM

## 2023-01-14 DIAGNOSIS — E782 Mixed hyperlipidemia: Secondary | ICD-10-CM

## 2023-01-14 NOTE — Progress Notes (Signed)
Subjective:    Patient ID: Kelsey Keith, female   DOB: 12/17/1975, 47 y.o.   MRN: [id number]   HPI  Lots of questions about thyroid disease and whether that has been checked and Menopausal symptoms, particularly pertaining to her weight and depressive issues in past year..   She continues to feel she is doing well with anti depressant.   Shows videos of her daughter and husband at recent wedding. Oldest daughter teaches 3rd grade at [employer] and younger daughter graduated from [employer] in [employer].  Younger daughter now back from [place] and in [place].    Current Meds  Medication Sig   AgaMatrix Ultra-Thin Lancets MISC Check sugars twice daily before meals   atorvastatin (LIPITOR) 80 MG tablet Take 1 tablet (80 mg total) by mouth daily.   Blood Glucose Monitoring Suppl (AGAMATRIX PRESTO) w/Device KIT Check sugars twice daily before meals   cetirizine (ZYRTEC) 10 MG tablet Take 1 tablet (10 mg total) by mouth daily.   ferrous gluconate (FERGON) 324 MG tablet 1 tab by mouth daily with citrus fruit (Patient taking differently: 1 tab by mouth daily with citrus fruit.)   glucose blood (AGAMATRIX PRESTO TEST) test strip Check sugars twice daily before meals   metFORMIN (GLUCOPHAGE-XR) 500 MG 24 hr tablet 1 tab by mouth twice daily with meals   sertraline (ZOLOFT) 50 MG tablet 1/2 tab by mouth daily for 7 days then increase to 1 tab daily   No Known Allergies   Review of Systems    Objective:   BP 104/62 (BP Location: Left Arm, Patient Position: Sitting, Cuff Size: Normal)   Pulse 70   Resp 16   Ht 5' 0.75" (1.543 m)   Wt 184 lb (83.5 kg)   LMP 12/29/2022 (Approximate)   BMI 35.05 kg/m   Physical Exam NAD   Assessment & Plan   Obesity and concerns for thyroid disease.  Discussed she has been checked for this in recent past and normal.    2.  Depression:  controlled.  3.  HM:  influenza vaccine.  4.  DM/ hyperlipidemia:   did not realize she had labs drawn prior to today's visit.  Her DM and cholesterol are not at goal.  She has yet to get started on Jardiance.  Will have Jacqlyn Larsen call and let her know she needs to get started.  Suspect she is also missing her atorvastatin.

## 2023-01-15 ENCOUNTER — Other Ambulatory Visit: Payer: Self-pay | Admitting: Psychology

## 2023-01-21 ENCOUNTER — Other Ambulatory Visit: Payer: Self-pay | Admitting: Internal Medicine

## 2023-01-21 DIAGNOSIS — G4733 Obstructive sleep apnea (adult) (pediatric): Secondary | ICD-10-CM

## 2023-01-21 NOTE — Progress Notes (Signed)
SOAP Notes Session Summary Provider / Clinician's Name: Leander Rams Jerilyn Gillaspie  Client Name:  Heloise Beecham Resendiz-Rodriquez Date of Service: 11/13/2022  Duration: 60 mins Subjective: Client reports, "I am feeling a little better. I have been on my medication for a little over a week. I know that is has not had enough time to work the way that it fully should, but I have been on this medication before and it always gets me to feeling better pretty quickly. That is what I am beginning to feel, slightly better. I do not know how to explain this but sometimes I just feel empty inside. I have gone through periods in my life where this would happen. There was no significant thing that would happen to make me start feeling this way. In fact sometimes it seems that things would actually be going really well and then one day I would wake up and I just would not have any feelings. It was like I was empty inside. Each time I would eventually get to the doctor and they would put me on medication. About 3-4 weeks later I would start feeling better. I would take my medication for a long while and then I would feel so much better. I would quit taking it and I would be fine for a while and then it would happen again. This started in my 20's and has happened about 4 times since then. This last time was really scary because I am happily married and I love my kids so much. [place] my son's birthday and I could not even get out of the bed. I felt nothing so I made an appointment for as soon as I got back to let the doctor know what was going on."  Objective: Client appeared to feel comfortable telling me about the patterns that she faces with her mental health. The client reported, that there was no traumatic event that occurred or something that she can significantly pick out that make these depressive moments occur.  It appears that the medication does provide the benefit of getting her  back to baseline, as she reports that she can already tell a difference when she is on them. It seems that she most likely needs to remain on the medication because when she goes off of it she does okay for a period of time and then these thoughts and feelings begin to reoccur.   Assessment: It was apparent that the client found the session to be beneficial, as she reported that it was very helpful to get things off of her mind. She has been on medication and in therapy at different points in her life and she reported that was helpful at the time as well. We normalized the fact that it is okay to take medication and continue therapy especially since there have been several reoccurrences. If something is working there is no shame to want to keep that up.  Plan: Client would benefit from bi-weekly sessions, especially right now as she is starting the medication to monitor the progress. With the goal of attending 2 sessions per month. The client began mindfulness and box breathing exercises to help with the anxious and depressive symptoms. The client will continue practicing these exercises daily. Client reports, "anxiety and depression level currently at a 9 and would like to see this come down to a 3 with the use of interventions. The client would like to improve overall self-worth. The client reports  that when she is in this state of mind that she has little self worth. She reports that she views herself as a 2 and would like to get that up to a 10. The client will begin each day with her favorite cup of coffee and writing down at least two self motivating quotes down in a journal. The client will write any feeling or emotions that this brings up.

## 2023-01-25 ENCOUNTER — Other Ambulatory Visit: Payer: Self-pay | Admitting: Psychology

## 2023-02-18 ENCOUNTER — Other Ambulatory Visit: Payer: Self-pay

## 2023-02-18 MED ORDER — METFORMIN HCL ER 500 MG PO TB24: ORAL_TABLET | ORAL | 3 refills | Status: DC

## 2023-04-20 ENCOUNTER — Other Ambulatory Visit: Payer: Self-pay | Admitting: Internal Medicine

## 2023-06-14 ENCOUNTER — Other Ambulatory Visit: Payer: Self-pay

## 2023-06-14 DIAGNOSIS — Z0000 Encounter for general adult medical examination without abnormal findings: Secondary | ICD-10-CM

## 2023-06-16 LAB — CBC WITH DIFFERENTIAL/PLATELET
Basophils Absolute: 0 10*3/uL (ref 0.0–0.2)
Basos: 1 %
EOS (ABSOLUTE): 0.8 10*3/uL — ABNORMAL HIGH (ref 0.0–0.4)
Eos: 12 %
Hematocrit: 36.1 % (ref 34.0–46.6)
Hemoglobin: 11.2 g/dL (ref 11.1–15.9)
Immature Grans (Abs): 0 10*3/uL (ref 0.0–0.1)
Immature Granulocytes: 0 %
Lymphocytes Absolute: 2.1 10*3/uL (ref 0.7–3.1)
Lymphs: 31 %
MCH: 23.5 pg — ABNORMAL LOW (ref 26.6–33.0)
MCHC: 31 g/dL — ABNORMAL LOW (ref 31.5–35.7)
MCV: 76 fL — ABNORMAL LOW (ref 79–97)
Monocytes Absolute: 0.5 10*3/uL (ref 0.1–0.9)
Monocytes: 7 %
Neutrophils Absolute: 3.4 10*3/uL (ref 1.4–7.0)
Neutrophils: 49 %
Platelets: 285 10*3/uL (ref 150–450)
RBC: 4.77 x10E6/uL (ref 3.77–5.28)
RDW: 16 % — ABNORMAL HIGH (ref 11.7–15.4)
WBC: 6.9 10*3/uL (ref 3.4–10.8)

## 2023-06-16 LAB — LIPID PANEL W/O CHOL/HDL RATIO
Cholesterol, Total: 243 mg/dL — ABNORMAL HIGH (ref 100–199)
HDL: 61 mg/dL (ref >39)
LDL Chol Calc (NIH): 152 mg/dL — ABNORMAL HIGH (ref 0–99)
Triglycerides: 170 mg/dL — ABNORMAL HIGH (ref 0–149)
VLDL Cholesterol Cal: 30 mg/dL (ref 5–40)

## 2023-06-16 LAB — COMPREHENSIVE METABOLIC PANEL
ALT: 18 [IU]/L (ref 0–32)
AST: 15 [IU]/L (ref 0–40)
Albumin: 4.1 g/dL (ref 3.9–4.9)
Alkaline Phosphatase: 82 [IU]/L (ref 44–121)
BUN/Creatinine Ratio: 18 (ref 9–23)
BUN: 13 mg/dL (ref 6–24)
Bilirubin Total: 0.4 mg/dL (ref 0.0–1.2)
CO2: 22 mmol/L (ref 20–29)
Calcium: 9.2 mg/dL (ref 8.7–10.2)
Chloride: 102 mmol/L (ref 96–106)
Creatinine, Ser: 0.74 mg/dL (ref 0.57–1.00)
Globulin, Total: 3 g/dL (ref 1.5–4.5)
Glucose: 179 mg/dL — ABNORMAL HIGH (ref 70–99)
Potassium: 4.4 mmol/L (ref 3.5–5.2)
Sodium: 138 mmol/L (ref 134–144)
Total Protein: 7.1 g/dL (ref 6.0–8.5)
eGFR: 99 mL/min/{1.73_m2} (ref >59)

## 2023-06-16 LAB — HEMOGLOBIN A1C
Est. average glucose Bld gHb Est-mCnc: 200 mg/dL
Hgb A1c MFr Bld: 8.6 % — ABNORMAL HIGH (ref 4.8–5.6)

## 2023-06-16 LAB — MICROALBUMIN / CREATININE URINE RATIO: Creatinine, Urine: 98 mg/dL

## 2023-06-16 LAB — TSH: TSH: 2.09 u[IU]/mL (ref 0.450–4.500)

## 2023-06-19 ENCOUNTER — Other Ambulatory Visit: Payer: Self-pay | Admitting: Internal Medicine

## 2023-06-19 ENCOUNTER — Ambulatory Visit: Payer: Self-pay | Admitting: Internal Medicine

## 2023-06-19 ENCOUNTER — Encounter: Payer: Self-pay | Admitting: Internal Medicine

## 2023-06-19 VITALS — BP 120/70 | HR 75 | Resp 16 | Ht 60.5 in | Wt 191.0 lb

## 2023-06-19 DIAGNOSIS — E782 Mixed hyperlipidemia: Secondary | ICD-10-CM

## 2023-06-19 DIAGNOSIS — S025XXA Fracture of tooth (traumatic), initial encounter for closed fracture: Secondary | ICD-10-CM

## 2023-06-19 DIAGNOSIS — E118 Type 2 diabetes mellitus with unspecified complications: Secondary | ICD-10-CM

## 2023-06-19 DIAGNOSIS — E6609 Other obesity due to excess calories: Secondary | ICD-10-CM

## 2023-06-19 DIAGNOSIS — G4730 Sleep apnea, unspecified: Secondary | ICD-10-CM

## 2023-06-19 DIAGNOSIS — Z0000 Encounter for general adult medical examination without abnormal findings: Secondary | ICD-10-CM

## 2023-06-19 DIAGNOSIS — Z23 Encounter for immunization: Secondary | ICD-10-CM

## 2023-06-19 DIAGNOSIS — E66812 Obesity, class 2: Secondary | ICD-10-CM

## 2023-06-19 DIAGNOSIS — Z124 Encounter for screening for malignant neoplasm of cervix: Secondary | ICD-10-CM

## 2023-06-19 DIAGNOSIS — Z1231 Encounter for screening mammogram for malignant neoplasm of breast: Secondary | ICD-10-CM

## 2023-06-19 MED ORDER — AGAMATRIX PRESTO TEST VI STRP: ORAL_STRIP | 12 refills | Status: AC

## 2023-06-19 MED ORDER — OZEMPIC (0.25 OR 0.5 MG/DOSE) 2 MG/3ML ~~LOC~~ SOPN: PEN_INJECTOR | SUBCUTANEOUS | 11 refills | Status: DC

## 2023-06-19 MED ORDER — ATORVASTATIN CALCIUM 80 MG PO TABS: 80.0000 mg | ORAL_TABLET | Freq: Every day | ORAL | 3 refills | Status: DC

## 2023-06-19 MED ORDER — EMPAGLIFLOZIN 10 MG PO TABS: 10.0000 mg | ORAL_TABLET | Freq: Every day | ORAL | 11 refills | Status: DC

## 2023-06-19 MED ORDER — AGAMATRIX ULTRA-THIN LANCETS MISC: 11 refills | Status: DC

## 2023-06-19 NOTE — Progress Notes (Signed)
 Subjective:    Patient ID: Kelsey Keith, female   DOB: 07-07-1975, 48 y.o.   MRN: [id number]   HPI  CPE with pap  1.  Pap: Last 03/2021 and normal.    2.  Mammogram:  Last 07/2017 and normal.  No family history of breast cancer.    3.  Osteoprevention:  Eats or drinks dairy products 3 times daily.  Not physically.    4.  Guaiac Cards/FIT:  Last in 2019 and negative.  5.  Colonoscopy:  Never.  No family history of colon cancer.    6.  Immunizations:  Needs shingrix and covid.  Will need Td in November.   Immunization History  Administered Date(s) Administered   Hep A / Hep B 04/10/2021   Hepatitis A, Adult 03/22/2022   Hepatitis B, ADULT 08/31/2021, 03/22/2022   Influenza Inj Mdck Quad With Preservative 02/17/2020   Influenza, Mdck, Trivalent,PF 6+ MOS(egg free) 01/14/2023   Influenza,inj,Quad PF,6+ Mos 02/15/2022   Influenza-Unspecified 02/05/2019   Moderna Covid-19 Fall Seasonal Vaccine 67yrs & older 02/15/2022   PFIZER(Purple Top)SARS-COV-2 Vaccination 07/01/2019, 07/22/2019   Pfizer Covid-19 Vaccine Bivalent Booster 41yrs & up 04/10/2021   Pneumococcal Polysaccharide-23 08/26/2018   Tdap 03/16/2014     7.  Glucose/Cholesterol:  A1C now up to 8.6%..Cholesterol also much higher and not taking Atorvastatin regularly Lipid Panel     Component Value Date/Time   CHOL 243 (H) 06/14/2023 0838   TRIG 170 (H) 06/14/2023 0838   TRIG 135 07/27/2016 1010   HDL 61 06/14/2023 0838   CHOLHDL 5.0 (H) 07/27/2016 1010   CHOLHDL 5.2 Ratio 08/13/2007 2049   VLDL 33 08/13/2007 2049   LDLCALC 152 (H) 06/14/2023 0838   LABVLDL 30 06/14/2023 0838     Current Meds  Medication Sig   AgaMatrix Ultra-Thin Lancets MISC Check sugars twice daily before meals   atorvastatin (LIPITOR) 80 MG tablet Take 1 tablet (80 mg total) by mouth daily.   Blood Glucose Monitoring Suppl (AGAMATRIX PRESTO) w/Device KIT Check sugars twice daily before meals   cetirizine (ZYRTEC) 10  MG tablet Take 1 tablet (10 mg total) by mouth daily.   ferrous gluconate (FERGON) 324 MG tablet 1 tab by mouth daily with citrus fruit (Patient taking differently: 1 tab by mouth daily with citrus fruit.)   glucose blood (AGAMATRIX PRESTO TEST) test strip Check sugars twice daily before meals   metFORMIN (GLUCOPHAGE-XR) 500 MG 24 hr tablet 1 tab by mouth twice daily with meals   sertraline (ZOLOFT) 50 MG tablet TAKE 1/2 (ONE-HALF) TABLET BY MOUTH FOR 7 DAYS THEN 1 BY MOUTH ONCE DAILY   No Known Allergies  Past Medical History:  Diagnosis Date   Depression age 49 yo and 66   First episode age 37 yo, then another after child born 2002.  Was treated for 5 years then with Zoloft.  Has not needed treatment since 2007.   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   Past Surgical History:  Procedure Laterality Date   CESAREAN SECTION  1999, 2002,2012   TUBAL LIGATION  2012   Family History  Problem Relation Age of Onset   Heart disease Mother    Stroke Mother    Hypertension Mother    Heart disease Father    Stroke Father    Diabetes Brother    Irritable bowel syndrome Daughter  Breast cancer Neg Hx    Social History   Socioeconomic History   Marital status: Married    Spouse name: Alayn   Number of children: 3   Years of education: 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   Highest education level: Not on file  Occupational History   Occupation: housecleaning  Tobacco Use   Smoking status: Former    Current packs/day: 0.00    Average packs/day: 1 pack/day for 20.0 years (20.0 ttl pk-yrs)    Types: Cigarettes    Start date: 07/24/1992    Quit date: 07/24/2012    Years since quitting: 10.9    Passive exposure: Past   Smokeless tobacco: Never  Vaping Use   Vaping status: Never Used  Substance and Sexual Activity   Alcohol use: No   Drug use: No   Sexual activity: Yes    Birth  control/protection: Surgical  Other Topics Concern   Not on file  Social History Narrative      Lives at home husband, [place], and 2 children.   One daughter newly married   Social Drivers of Corporate investment banker Strain: Low Risk  (06/19/2023)   Overall Financial Resource Strain (CARDIA)    Difficulty of Paying Living Expenses: Not hard at all  Food Insecurity: No Food Insecurity (06/19/2023)   Hunger Vital Sign    Worried About Running Out of Food in the Last Year: Never true    Ran Out of Food in the Last Year: Never true  Transportation Needs: No Transportation Needs (06/19/2023)   PRAPARE - Administrator, Civil Service (Medical): No    Lack of Transportation (Non-Medical): No  Physical Activity: Not on file  Stress: Not on file  Social Connections: Not on file  Intimate Partner Violence: Not At Risk (06/19/2023)   Humiliation, Afraid, Rape, and Kick questionnaire    Fear of Current or Ex-Partner: No    Emotionally Abused: No    Physically Abused: No    Sexually Abused: No      Review of Systems  HENT:  Positive for dental problem (Broken tooth--will see dentist in 2 weeks.).   Respiratory:  Negative for shortness of breath.   Cardiovascular:  Negative for chest pain, palpitations and leg swelling.  Gastrointestinal:  Negative for blood in stool (No melena).  Genitourinary:        Breast tenderness on left.  Last period was Jan 8th.   BTL in past      Objective:   BP 120/70 (BP Location: Left Arm, Patient Position: Sitting, Cuff Size: Normal)   Pulse 75   Resp 16   Ht 5' 0.5" (1.537 m)   Wt 191 lb (86.6 kg)   LMP 05/01/2023   BMI 36.69 kg/m   Physical Exam Constitutional:      Appearance: She is obese.  HENT:     Head: Normocephalic and atraumatic.     Right Ear: Tympanic membrane, ear canal and external ear normal.     Left Ear: Tympanic membrane, ear canal and external ear normal.     Nose: Nose normal.     Mouth/Throat:     Mouth:  Mucous membranes are moist.     Pharynx: Oropharynx is clear.  Eyes:     Extraocular Movements: Extraocular movements intact.     Conjunctiva/sclera: Conjunctivae normal.     Pupils: Pupils are equal, round, and reactive to light.     Comments: Discs sharp  Neck:  Thyroid: No thyroid mass or thyromegaly.  Cardiovascular:     Rate and Rhythm: Normal rate and regular rhythm.     Pulses:          Dorsalis pedis pulses are 2+ on the right side and 2+ on the left side.       Posterior tibial pulses are 2+ on the left side.     Heart sounds: S1 normal and S2 normal. No murmur heard.    No friction rub. No S3 or S4 sounds.     Comments: No carotid bruits.  Carotid, radial, femoral, DP and PT pulses normal and equal.   Pulmonary:     Effort: Pulmonary effort is normal.     Breath sounds: Normal breath sounds and air entry.  Chest:  Breasts:    Right: No inverted nipple, mass or nipple discharge.     Left: No inverted nipple, mass or nipple discharge.  Abdominal:     General: Bowel sounds are normal.     Palpations: Abdomen is soft. There is no hepatomegaly, splenomegaly or mass.     Tenderness: There is no abdominal tenderness.     Hernia: No hernia is present.  Genitourinary:    Comments: Normal external genitalia No cervical or vaginal lesions No uterine or adnexal mass or tenderness. Musculoskeletal:        General: Normal range of motion.     Cervical back: Normal range of motion and neck supple.     Right lower leg: No edema.     Left lower leg: No edema.  Feet:     Right foot:     Protective Sensation: 10 sites tested.  10 sites sensed.     Skin integrity: Skin integrity normal.     Toenail Condition: Right toenails are normal.     Left foot:     Protective Sensation: 10 sites tested.  10 sites sensed.     Skin integrity: Skin integrity normal.     Toenail Condition: Left toenails are normal.  Lymphadenopathy:     Head:     Right side of head: No submental or  submandibular adenopathy.     Left side of head: No submental or submandibular adenopathy.     Cervical: No cervical adenopathy.     Upper Body:     Right upper body: No supraclavicular or axillary adenopathy.     Left upper body: No supraclavicular or axillary adenopathy.     Lower Body: No right inguinal adenopathy. No left inguinal adenopathy.  Skin:    General: Skin is warm.     Capillary Refill: Capillary refill takes less than 2 seconds.     Findings: No lesion or rash.  Neurological:     General: No focal deficit present.     Mental Status: She is alert and oriented to person, place, and time.     Cranial Nerves: Cranial nerves 2-12 are intact.     Sensory: Sensation is intact.     Motor: Motor function is intact.     Coordination: Coordination is intact.     Gait: Gait is intact.     Deep Tendon Reflexes: Reflexes are normal and symmetric.  Psychiatric:        Mood and Affect: Mood normal.        Speech: Speech normal.        Behavior: Behavior normal.      Assessment & Plan   CPE with pap Mammogram ordered COVID vaccine:  Spikevax/Moderna  Return in 1 month for Shingrix and Pneumococcal 20 Return beginning of December for Td. FIT to return in 2 weeks.  2.  OSA:  still has not received CPAP supplies--could not afford reportedly through [employer].  Jacqlyn Larsen given new orders to check in on supply companies with lower cost.  3.  DM:  Not controlled.  went over MAP again--she never presented to [facility] pharm to apply for Jardiance.  Sending her to get Jardiance again as well as Ozempic.  She will call when has the Ozempic and follow up in 2 weeks after starting.   To keep track of blood glucose twice daily before meals and bring those in to appts.  4.  Hyperlipidemia:  Missing Atorvastatin.  Sent for lower cost to [facility] pharm.  To make sure med with Metformin for evening dose.  5.  Broken tooth:  dental referral.

## 2023-06-19 NOTE — Patient Instructions (Addendum)
 Once you start ozempic--call office for 2 week follow up and bring in sugars--check twice daily before breakfast and dinner.

## 2023-06-24 ENCOUNTER — Encounter: Payer: Self-pay | Admitting: Internal Medicine

## 2023-06-24 LAB — CYTOLOGY - PAP

## 2023-06-25 ENCOUNTER — Ambulatory Visit: Payer: Self-pay | Admitting: Internal Medicine

## 2023-07-17 ENCOUNTER — Ambulatory Visit: Payer: Self-pay | Admitting: Internal Medicine

## 2023-07-17 DIAGNOSIS — Z23 Encounter for immunization: Secondary | ICD-10-CM

## 2023-07-18 ENCOUNTER — Inpatient Hospital Stay: Admission: RE | Admit: 2023-07-18 | Payer: Self-pay | Source: Ambulatory Visit

## 2023-07-25 ENCOUNTER — Ambulatory Visit
Admission: RE | Admit: 2023-07-25 | Discharge: 2023-07-25 | Disposition: A | Discharge status: Home / Self Care | Payer: Self-pay | Source: Ambulatory Visit | Attending: Internal Medicine | Admitting: Internal Medicine

## 2023-07-25 DIAGNOSIS — Z1231 Encounter for screening mammogram for malignant neoplasm of breast: Secondary | ICD-10-CM

## 2023-09-23 ENCOUNTER — Other Ambulatory Visit: Payer: Self-pay

## 2023-09-23 MED ORDER — METFORMIN HCL ER 500 MG PO TB24: ORAL_TABLET | ORAL | 3 refills | Status: DC

## 2023-10-21 ENCOUNTER — Other Ambulatory Visit: Payer: Self-pay

## 2023-10-24 ENCOUNTER — Ambulatory Visit: Payer: Self-pay | Admitting: Internal Medicine

## 2023-12-13 ENCOUNTER — Other Ambulatory Visit: Payer: Self-pay

## 2023-12-13 DIAGNOSIS — E782 Mixed hyperlipidemia: Secondary | ICD-10-CM

## 2023-12-13 DIAGNOSIS — E118 Type 2 diabetes mellitus with unspecified complications: Secondary | ICD-10-CM

## 2023-12-14 LAB — COMPREHENSIVE METABOLIC PANEL WITH GFR
ALT: 13 IU/L (ref 0–32)
AST: 9 IU/L (ref 0–40)
Albumin: 3.7 g/dL — ABNORMAL LOW (ref 3.9–4.9)
Alkaline Phosphatase: 86 IU/L (ref 44–121)
BUN/Creatinine Ratio: 15 (ref 9–23)
BUN: 11 mg/dL (ref 6–24)
Bilirubin Total: 0.4 mg/dL (ref 0.0–1.2)
CO2: 19 mmol/L — ABNORMAL LOW (ref 20–29)
Calcium: 8.7 mg/dL (ref 8.7–10.2)
Chloride: 105 mmol/L (ref 96–106)
Creatinine, Ser: 0.75 mg/dL (ref 0.57–1.00)
Globulin, Total: 2.8 g/dL (ref 1.5–4.5)
Glucose: 173 mg/dL — ABNORMAL HIGH (ref 70–99)
Potassium: 4 mmol/L (ref 3.5–5.2)
Sodium: 136 mmol/L (ref 134–144)
Total Protein: 6.5 g/dL (ref 6.0–8.5)
eGFR: 97 mL/min/1.73 (ref >59)

## 2023-12-14 LAB — LIPID PANEL W/O CHOL/HDL RATIO
Cholesterol, Total: 151 mg/dL (ref 100–199)
HDL: 56 mg/dL (ref >39)
LDL Chol Calc (NIH): 82 mg/dL (ref 0–99)
Triglycerides: 62 mg/dL (ref 0–149)
VLDL Cholesterol Cal: 13 mg/dL (ref 5–40)

## 2023-12-14 LAB — HEMOGLOBIN A1C
Est. average glucose Bld gHb Est-mCnc: 192 mg/dL
Hgb A1c MFr Bld: 8.3 % — ABNORMAL HIGH (ref 4.8–5.6)

## 2023-12-17 ENCOUNTER — Encounter: Payer: Self-pay | Admitting: Internal Medicine

## 2023-12-17 ENCOUNTER — Ambulatory Visit: Payer: Self-pay | Admitting: Internal Medicine

## 2023-12-17 ENCOUNTER — Other Ambulatory Visit: Payer: Self-pay

## 2023-12-17 VITALS — BP 127/80 | HR 75 | Resp 18 | Ht 60.0 in | Wt 188.0 lb

## 2023-12-17 DIAGNOSIS — G4700 Insomnia, unspecified: Secondary | ICD-10-CM | POA: Insufficient documentation

## 2023-12-17 DIAGNOSIS — N951 Menopausal and female climacteric states: Secondary | ICD-10-CM | POA: Insufficient documentation

## 2023-12-17 DIAGNOSIS — E66812 Obesity, class 2: Secondary | ICD-10-CM

## 2023-12-17 DIAGNOSIS — E119 Type 2 diabetes mellitus without complications: Secondary | ICD-10-CM

## 2023-12-17 DIAGNOSIS — Z91199 Patient's noncompliance with other medical treatment and regimen due to unspecified reason: Secondary | ICD-10-CM | POA: Insufficient documentation

## 2023-12-17 DIAGNOSIS — Z6836 Body mass index (BMI) 36.0-36.9, adult: Secondary | ICD-10-CM

## 2023-12-17 MED ORDER — EMPAGLIFLOZIN 10 MG PO TABS: 10.0000 mg | ORAL_TABLET | Freq: Every day | ORAL | 11 refills | Status: AC

## 2023-12-17 MED ORDER — OZEMPIC (0.25 OR 0.5 MG/DOSE) 2 MG/3ML ~~LOC~~ SOPN: PEN_INJECTOR | SUBCUTANEOUS | 11 refills | Status: AC

## 2023-12-17 NOTE — Assessment & Plan Note (Signed)
 Renewed Jiardiance/Ozempec. Continue Metformin .  Counciled on compliance .    Counciled on wt loss and exercise.

## 2023-12-17 NOTE — Progress Notes (Addendum)
 Subjective:  Pt was seen by me, Doc in Training under Dr. Adella.    I evaluated pt, present to Dr. Adella. She then saw pt herself . A/P formulated with Dr. Adella. D/C plan communicated to patient by Dr. Adella.  I counciled pt on diet, exercise, wt loss which were Ok'd by Dr. Adella.   This is a summary record of evaluation by both me and Dr. Adella.    Patient ID: Kelsey Keith, female    DOB: November 03, 1975, 48 y.o.   MRN: [id number]  Here for labs .  Incidentally told RN that she has problems with sleep when RN asked her do you have any concerns today? Wakes up at 4am and cannot go back to sleep.   1 month duration. . No incidents or changes noted prior to onset.  She thinks she might be menapausal.  Goes to bed at 10pm.   Tired during the day. Feels sleepy. Able to function but feels tired.   No exercise.  Coffee only in the AM. Has worrisome news regarding immigration but not that much.  Sewing class on Wed for past time.  Kids are OK. No sources of stress.  Only drinks 2 16 oz of water per day.  Will take care of Ozempic  today.   Has not been able to take care of it. Everyone else is good with sleep in the house.  Snores at nights.  Sleeping study positive. BiPAB purchased but not being used . Says she cannot use it well; makes her feel that she cannot breath.  Eats dinner at 7pm. 9pm takes metformin . Blood surgars at 730am around 130.   Does not feel hungry in the middle  of the night.     3 months of hot flashes,   dysurea after intercouse occasionally.  Period this time occurring after 35 days rather than normal 28 days.       Review of Systems  Constitutional:  Positive for fatigue.       Pt does not understand that jaridance and ozempec are 2 different meds. Has not gone to apply for it . 6 months of not taking it.  Respiratory:  Positive for shortness of breath.        When walking a lot and with using Bipap.  + sleep study.  Non compliant with BiPAP  because she does not know how to use it.  SOB with too much walking  Cardiovascular:  Negative for palpitations.  Musculoskeletal:        No pain contributing to poor sleep  Skin:  Negative for rash.  Psychiatric/Behavioral:  Positive for sleep disturbance. Negative for agitation and behavioral problems. The patient is nervous/anxious.        Only thing she feels upset about is recent political issues of her people although her position is Ok.    Current Meds  Medication Sig   AgaMatrix Ultra-Thin Lancets MISC Check sugars twice daily before meals   atorvastatin  (LIPITOR) 80 MG tablet Take 1 tablet (80 mg total) by mouth daily.   Blood Glucose Monitoring Suppl (AGAMATRIX PRESTO) w/Device KIT Check sugars twice daily before meals   cetirizine  (ZYRTEC ) 10 MG tablet Take 1 tablet (10 mg total) by mouth daily.   ferrous gluconate  (FERGON) 324 MG tablet 1 tab by mouth daily with citrus fruit   glucose blood (AGAMATRIX PRESTO TEST) test strip Check sugars twice daily before meals   metFORMIN  (GLUCOPHAGE -XR) 500 MG 24 hr tablet 1 tab by  mouth twice daily with meals   sertraline  (ZOLOFT ) 50 MG tablet TAKE 1/2 (ONE-HALF) TABLET BY MOUTH FOR 7 DAYS THEN 1 BY MOUTH ONCE DAILY    No Known Allergies      Objective:   Physical Exam Vitals and nursing note reviewed.  Constitutional:      General: She is not in acute distress.    Appearance: Normal appearance. She is obese. She is not ill-appearing, toxic-appearing or diaphoretic.  HENT:     Head: Normocephalic and atraumatic.     Nose: Nose normal.     Mouth/Throat:     Mouth: Mucous membranes are moist.  Eyes:     Pupils: Pupils are equal, round, and reactive to light.  Cardiovascular:     Rate and Rhythm: Normal rate.  Pulmonary:     Effort: Pulmonary effort is normal.     Comments: Occaasional rhonci Musculoskeletal:        General: No swelling or deformity.     Cervical back: Normal range of motion.     Left lower leg: No edema.      Comments: No gross abnormality  Skin:    General: Skin is warm.  Neurological:     General: No focal deficit present.     Mental Status: She is alert and oriented to person, place, and time.     Gait: Gait normal.  Psychiatric:        Mood and Affect: Mood normal.        Behavior: Behavior normal.        Thought Content: Thought content normal.        Judgment: Judgment normal.           Assessment & Plan:   1- Insomnia of 1 month duration-  Pt advised to continue trying the CPAP for a longer period of time until she can get used to it. Otherwise, she can bring it to clinic to see if we can sort it with her.   Resp Therapy unavailable at this time to help.     Optimize body parameters by Increasing water to 6-8 glass per day but not at night, losing wt by calorie restricting to less than 2000 cal/day,  exercising .   Reading a book and not watching TV if unable to fall alseep after 20 minutes of lying in bed.   Check blood glucose if shaky and sweaty at 4 am when she wakes up to make sure she is not getting hypoglycemic.  Not watching TV and discussing stressful political events.  2- Early peri-menopausal contributing to #1- monitor for now 3- Hgb A1C noted at 8.3- Counciled on exercise, wt loss, continue metformin  , council on compliance on getting Jardiance  and Ozempec prescriptions filled.   Re- prescribed Jiardiance and Ozempec.  F/u as scheduled.

## 2023-12-17 NOTE — Assessment & Plan Note (Signed)
 Calorie restriction to less than 2000 cal /day, monitoring of calorie intake, exercise, ozepmic , Jiardiance

## 2023-12-17 NOTE — Assessment & Plan Note (Addendum)
 1- May be due to sleep apnea- not using her cpap.  Pt instructed to use her Cpap.  Resp Therapy unavailable to assist.    Educated by Dr. Adella on why she needs to wear it.  She was advised to try wearing it longer until she gets used to it.   2- Pt to note if she is shaky at that time.  she is to check her blood sugar to make sure it is not coming from hypoglycemia as dinner is at 7pm , metformin  at 9pm  3-  Counciled on wt loss as this will help sleep apnea-    she is to count calories and keep it under 2000 calories.   4- Needs to drink 6-8 glasses of water a day but not at night to optimize body needs  5- exercise- start 10 minutes per day and work up to 30 minutes a day in small steps.  6- Perimneapausal?- Too early to diagnose- Moniter  7- Decrease watching news and discussing current political issues that is stressing her out

## 2023-12-20 ENCOUNTER — Telehealth: Payer: Self-pay | Admitting: Internal Medicine

## 2023-12-20 ENCOUNTER — Other Ambulatory Visit: Payer: Self-pay

## 2023-12-20 MED ORDER — METFORMIN HCL ER 500 MG PO TB24: ORAL_TABLET | ORAL | 3 refills | Status: DC

## 2023-12-20 MED ORDER — FERROUS GLUCONATE 324 (38 FE) MG PO TABS: ORAL_TABLET | ORAL | Status: AC

## 2023-12-20 MED ORDER — SERTRALINE HCL 50 MG PO TABS: ORAL_TABLET | ORAL | 8 refills | Status: AC

## 2023-12-20 MED ORDER — AGAMATRIX ULTRA-THIN LANCETS MISC
11 refills | Status: AC
Start: 2023-12-20 — End: ?

## 2023-12-20 MED ORDER — ATORVASTATIN CALCIUM 80 MG PO TABS: 80.0000 mg | ORAL_TABLET | Freq: Every day | ORAL | 3 refills | Status: DC

## 2023-12-20 MED ORDER — CETIRIZINE HCL 10 MG PO TABS: 10.0000 mg | ORAL_TABLET | Freq: Every day | ORAL | 11 refills | Status: AC

## 2023-12-20 NOTE — Telephone Encounter (Signed)
 Patient called and states she [facility] and was told by the pharmacist that there was no prescriptions sent for her .  Patient states she was given the list of medications that she needs and pharmacy states they do not have prescription.   AgaMatrix Ultra-Thin Lancets MISC   atorvastatin  (LIPITOR) 80 MG tablet [[id number]]   cetirizine  (ZYRTEC ) 10 MG tablet [[id number]]   ferrous gluconate  (FERGON) 324 MG tablet [[id number]   metFORMIN  (GLUCOPHAGE -XR) 500 MG 24 hr tablet [[id number]]   sertraline  (ZOLOFT ) 50 MG tablet [[id number]]   --Patient also mentioned Jardiance  and Ozempic  . Asked patient if she has submitted application for those medications to the MAP program, patient stated that she did remember she needs to apply for those medications.

## 2023-12-20 NOTE — Telephone Encounter (Signed)
 Medication sent.

## 2024-01-20 ENCOUNTER — Ambulatory Visit (INDEPENDENT_AMBULATORY_CARE_PROVIDER_SITE_OTHER): Payer: Self-pay

## 2024-01-20 DIAGNOSIS — Z23 Encounter for immunization: Secondary | ICD-10-CM

## 2024-02-22 NOTE — Progress Notes (Signed)
 Seen with Dr. Correne.  Agree with assessment and plan  DM:  not taking Jardiance  and Ozempic  and A1C above goal  Encouraged her to get these meds started.  Represcribed.  LDL cholesterol also not at goal and suspect she is noncompliant with statin as well.  Encouraged her to work on this as well as lifestyle changes.

## 2024-03-26 ENCOUNTER — Telehealth: Payer: Self-pay | Admitting: Internal Medicine

## 2024-03-26 ENCOUNTER — Ambulatory Visit (INDEPENDENT_AMBULATORY_CARE_PROVIDER_SITE_OTHER): Payer: Self-pay | Admitting: Internal Medicine

## 2024-03-26 DIAGNOSIS — Z23 Encounter for immunization: Secondary | ICD-10-CM

## 2024-03-26 MED ORDER — METFORMIN HCL ER 500 MG PO TB24: ORAL_TABLET | ORAL | 3 refills | Status: AC

## 2024-03-26 MED ORDER — ATORVASTATIN CALCIUM 80 MG PO TABS: 80.0000 mg | ORAL_TABLET | Freq: Every day | ORAL | 3 refills | Status: AC

## 2024-03-26 NOTE — Telephone Encounter (Signed)
 Patient called and requested to change medications   atorvastatin  (LIPITOR)  metFORMIN  (GLUCOPHAGE -XR)   Patient states she lost her orange card and is unable to get medication from the health department.  Patient would like for medication to be changed to [facility] pharmacy on [address].

## 2024-03-27 NOTE — Telephone Encounter (Signed)
 Patient has been notified

## 2024-03-30 ENCOUNTER — Ambulatory Visit: Payer: Self-pay | Admitting: Internal Medicine

## 2024-06-23 ENCOUNTER — Encounter: Payer: Self-pay | Admitting: Internal Medicine
